# Patient Record
Sex: Male | Born: 1970
Health system: Southern US, Community
[De-identification: ages and names within clinical notes are randomized; demographics above are authoritative.]

## PROBLEM LIST (undated history)

## (undated) DIAGNOSIS — R0683 Snoring: Secondary | ICD-10-CM

## (undated) DIAGNOSIS — R7303 Prediabetes: Secondary | ICD-10-CM

## (undated) DIAGNOSIS — S86011A Strain of right Achilles tendon, initial encounter: Secondary | ICD-10-CM

## (undated) DIAGNOSIS — I10 Essential (primary) hypertension: Secondary | ICD-10-CM

## (undated) DIAGNOSIS — M1990 Unspecified osteoarthritis, unspecified site: Secondary | ICD-10-CM

## (undated) HISTORY — PX: HERNIA REPAIR: SHX51

## (undated) HISTORY — PX: COLONOSCOPY W/ BIOPSIES AND POLYPECTOMY: SHX1376

---

## 2000-02-25 ENCOUNTER — Emergency Department (HOSPITAL_COMMUNITY): Admission: EM | Admit: 2000-02-25 | Discharge: 2000-02-25 | Payer: Self-pay | Admitting: Emergency Medicine

## 2002-11-12 ENCOUNTER — Emergency Department (HOSPITAL_COMMUNITY): Admission: EM | Admit: 2002-11-12 | Discharge: 2002-11-12 | Payer: Self-pay | Admitting: Emergency Medicine

## 2006-10-24 ENCOUNTER — Observation Stay (HOSPITAL_COMMUNITY): Admission: EM | Admit: 2006-10-24 | Discharge: 2006-10-26 | Payer: Self-pay | Admitting: Emergency Medicine

## 2006-10-28 ENCOUNTER — Ambulatory Visit: Payer: Self-pay

## 2006-10-30 ENCOUNTER — Ambulatory Visit: Payer: Self-pay | Admitting: Internal Medicine

## 2010-05-29 NOTE — Consult Note (Signed)
NAMEEMILIAN, STAWICKI NO.:  [id number]   MEDICAL RECORD NO.:  [id number]          PATIENT TYPE:  INP   LOCATION:  3732                         FACILITY:  [facility]   PHYSICIAN:  Bevelyn Buckles. Bensimhon, MDDATE OF BIRTH:  [birth year]   DATE OF CONSULTATION:  10/25/2006  DATE OF DISCHARGE:                                 CONSULTATION   REFERRING PHYSICIAN:  Mobolaji B. Corky Downs, M.D., [employer].   REASON FOR CONSULTATION:  Chest pain.   PRIMARY CARE PHYSICIAN:  Dr. Kern Reap.   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 He did have some episodes of chest pain about 4 years ago  which was thought to be musculoskeletal. He never had a stress test or  cardiac catheterization.   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 He said there was some pressure  and a ripping feeling. He was not diaphoretic, there was no associated  nausea, vomiting or shortness of breath. He went to go see Dr. Barbee Shropshire  but the office was closed so he came to the emergency room. EKG was  normal. The first set of cardiac markers and a D-dimer were also normal.  He got one sublingual nitroglycerin which brought the pain down from a 7  to a 3. Subsequently the patient was almost completely relieved with  morphine but he sort of had a smoldering throughout the night. This  morning he says he feels somewhat better but when he takes a deep breath  he does have some discomfort. He is able to ambulate without pain.   At baseline, he is fairly active with his job and denies any exertional  chest pain or shortness of breath. He does not have a history of  hypertension or hyperlipidemia. There is no significant family history  of coronary artery disease and no trauma.   REVIEW OF SYSTEMS:  All systems negative except for  HPI and problem  list.   PAST MEDICAL HISTORY:  Seasonal allergies.   MEDICATIONS:  At home he just takes Zyrtec p.r.n. for allergies.  Currently he is on Lovenox, aspirin, Protonix and p.r.n. nitroglycerin.   ALLERGIES:  PENICILLIN.   FAMILY HISTORY:  Both parents are alive. Mother has hypertension and  recently diagnosed with COPD. Father is in good health. There is no  family history of premature coronary artery disease.   SOCIAL HISTORY:  He is married, he lives with his family, he has a 26-  year-old son and one on the way. 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 He denies any illicit drugs.   PHYSICAL EXAMINATION:  He is a muscular, well-appearing male in no acute  distress.  Respirations are unlabored. Blood pressure is 135/83, heart rate 74,  satting __________ % on 2 liters nasal cannula.  HEENT:  Normal.  NECK:  Supple. There is  no JVD. Carotids 2+ bilaterally without any  bruits. There is no lymphadenopathy or thyromegaly.  CARDIAC:  PMI is not displaced. He has a regular rate and rhythm with no  murmurs, rubs or gallops.  LUNGS:  Clear.  ABDOMEN:  Soft, nontender, nondistended. There is no hepatosplenomegaly,  no bruits, no masses, no epigastric pain.  EXTREMITIES:  Warm with no clubbing, cyanosis or edema. Good distal  pulses, no rash.  NEUROLOGIC:  Alert and oriented x3. Cranial nerves II-XII are intact.  Moves all 4 extremities without difficulty. Affect is very pleasant.   EKG shows sinus rhythm at a rate of 73. No ST-T wave abnormalities.  Borderline first degree AV block. Troponin is 0.02, then 0.01 and 0.03.  CK-MB is negative. Urine drug screen is negative. D-dimer is less than  0.22. Sodium 138, potassium 4.7, glucose 95, creatinine is 1.1.   ASSESSMENT:  1. Constant atypical chest pain x30 hours now decreasing with question      nitroglycerin responsive. EKG and troponin are  negative.  2. No cardiac risk factors.   PLAN/DISCUSSION:  I suspect this is noncardiac but given a question of  nitroglycerin responsiveness, I will need further risk stratification.  We discussed the risks and indications of both catheterization and  stress testing, he prefers testing. If his chest pain is relieved, we  will plan discharge home tomorrow with outpatient Myoview Monday or  Tuesday. If his chest pain recurs will consider inpatient Myoview on  Monday versus cardiac catheterization.   Of note, the ripping feeling he described is somewhat concerning for  aortic dissection though he does not really have any risk factors for  this; however, I think it would be prudent just to go ahead and get a CT  scan of the chest for safety sake.      Bevelyn Buckles. Bensimhon, MD  Electronically Signed     DRB/MEDQ  D:  10/25/2006  T:  10/25/2006  Job:  [id number]   cc:   Mobolaji B. Corky Downs, M.D.  Olene Craven, M.D.

## 2010-05-29 NOTE — H&P (Signed)
NAMEANDI, MAHAFFY NO.:  [id number]   MEDICAL RECORD NO.:  [id number]          PATIENT TYPE:  EMS   LOCATION:  MAJO                         FACILITY:  [facility]   PHYSICIAN:  Mobolaji B. Bakare, M.D.DATE OF BIRTH:  09-May-1970   DATE OF ADMISSION:  10/24/2006  DATE OF DISCHARGE:                              HISTORY & PHYSICAL   PRIMARY CARE PHYSICIAN:  Olene Craven, M.D.   CHIEF COMPLAINT:  Chest pain.   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  He developed sudden central chest pain which he rated at 7/10.  He was not diaphoretic.  There was no associated nausea, vomiting, or  shortness of breath.  The pain did not go away.  He decided to call his  wife, who brought him to the emergency room.   Patient stated that he has had borderline cholesterol, but he has not  been on medications.  He has been watching his diet.   On arrival in the emergency room, patient was hemodynamically stable.  He had a normal sinus EKG without acute ST changes.  His first set of  cardiac markers were normal.  His D-dimer was also normal.  He received  sublingual nitroglycerin one dose, which brought the pain from 7 to 3.  Subsequently, the pain was almost completely relieved by morphine IV.   Patient has not had similar pain in the recent past.  He denied any  chest trauma or physical exertion recently that could contribute to  these.  He states that on deep breathing, this aggravates the chest  pain.  There is no cough or shortness of breath.  No recent travel or  lower extremity swelling.   REVIEW OF SYSTEMS:  He denies fever, chills, nausea, vomiting, diarrhea.  No dysuria or urgency.   PAST MEDICAL HISTORY:  Seasonal allergies.   PAST SURGICAL HISTORY:  None.   MEDICATIONS:  He uses Zyrtec for seasonal allergies.   ALLERGIES:  PENICILLIN.   FAMILY HISTORY:   Both parents are alive.  Mother has hypertension.  Father has no known medical history.  There is no family history of  premature coronary artery disease.   SOCIAL HISTORY:  He is married.  He lives with his family.  He has a 69-  year-old son.  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  He does not smoke cigarettes or use alcohol.  He  denies the use of illicit drugs.   PHYSICAL EXAMINATION:  INITIAL VITALS:  Temperature 98.8, blood pressure  157/96.  Subsequent recheck was 142/91.  Pulse 70.  Respiratory rate 14.  O2 sats 98%.  On examination, he is awake, alert and oriented to time, place, and  person.  Normocephalic.  Extraocular muscle movements are intact.  No elevated JVD.  There is no carotid bruit.  No cervical  lymphadenopathy or thyromegaly.  Mucous membranes are moist.  There is no oral thrush.  LUNGS:  Clear clinically  to auscultation.  CVS:  S1 and S2.  Regular.  No murmur.  ABDOMEN:  Nondistended.  Soft, nontender.  Bowel sounds present.  No  palpable organomegaly.  CHEST WALL:  There is mild tenderness over the manubrium, but patient  did not relate this to the pain he had.  EXTREMITIES:  No pedal edema or calf tenderness.  No cyanosis.  CNS:  No focal neurological deficit.   INITIAL LABORATORY DATA:  Sodium 138, potassium 4.7, chloride 108, BUN  14, creatinine 1.1.  Urine drug screen nondetected.  Cardiac markers at  the point of care x1 set was normal.   ASSESSMENT/PLAN:  Mr. Meador is a 40 year old African-American male with  known history of borderline hypercholesterolemia presenting with  retrosternal chest pain.  This does not sound typical.  He had a normal  EKG, and four sets of cardiac markers is negative.  D-dimer is normal.  Hemoglobin 16, hematocrit 47.  He is noted to have high blood pressure  on arrival in the emergency department, but this is trending downwards  after relief of pain.  Will continue to monitor blood  pressure during  the course of hospitalization.  Patient will be admitted to rule out  myocardial infarction.  It appears that he has low risk for coronary  artery disease.  We checked fasting lipid profile, hemoglobin A1C, cycle  cardiac enzymes q.8h. x3.  Will give morphine 1-2 mg IV q.2h. p.r.n.  chest pain, sublingual nitroglycerin p.r.n., aspirin 325 mg p.o. daily.  Will check a 2D echocardiogram.      Mobolaji B. Corky Downs, M.D.  Electronically Signed     MBB/MEDQ  D:  10/24/2006  T:  10/24/2006  Job:  [id number]   cc:   Olene Craven, M.D.

## 2010-06-01 NOTE — Discharge Summary (Signed)
NAMEKOSEI, RHODES                 ACCOUNT NO.:  [id number]   MEDICAL RECORD NO.:  [id number]          PATIENT TYPE:  OBV   LOCATION:  3732                         FACILITY:  [facility]   PHYSICIAN:  Mobolaji B. Bakare, M.D.DATE OF BIRTH:  [birth year]   DATE OF ADMISSION:  10/24/2006  DATE OF DISCHARGE:  10/26/2006                               DISCHARGE SUMMARY   FINAL DIAGNOSIS:  1. Atypical chest pain.  2. Dyslipidemia.   PROCEDURES:  1. Chest X-Ray: Showed no acute disease.  2. CT angiogram of the chest--no evidence of pulmonary embolism, no      evidence of aortic aneurysm or dissection, no acute thoracic      abnormalities.   CONSULT:  Cardiology consult provided by Bevelyn Buckles. Bensimhon, MD.   BRIEF HISTORY:  Please refer to the admission H&P by Dr. Corky Downs for full  details.  In brief, Mr. Blouch is a 40 year old African-American male  with a past medical history of dyslipidemia, but he was not on  medications,.  The patient was well and in his usual state of health  until about 9:30 on the morning of admission, when he developed sudden  central chest pain at work.  He was not exerting himself at that point.  He was not diaphoretic.  There was no associated nausea, vomiting, or  shortness of breath.  He called his wife who brought him to the  emergency room.   On initial evaluation in the emergency room, the EKG showed normal sinus  rhythm, cardiac markers at the point of care were normal.  He received  sublingual nitroglycerin which brought the pain down from 7 to 3 and he  became completely abated with IV morphine.  Physical examination did not  suggest musculoskeletal tenderness.  He was admitted for further  evaluation.   The patient developed recurrent episodes of chest pain about twice  during the course of hospitalization and on each episode he stated that  he had some relief with sublingual nitroglycerin.  He eventually ruled  out for myocardial infarction with three  negative sets of cardiac panel  EKG which they reported chest pain were normal.  Cardiology was  consulted for further evaluation. CT angiogram of the chest was  recommended to rule out dissection or pulmonary embolism.  CT was  unremarkable.  The patient was made to do an outpatient Myoview study.  This was set up by Dr. Prescott Gum office.   The patient had a fasting lipid profile with total cholesterol of 180,  LDL of 122, HDL of 29.  He was counseled on low-fat diet and started on  Zocor.  His blood pressure was normal during the course of  hospitalization.  He is not a smoker.  There is no family history of  premature coronary artery disease.  The patient is felt to have a low  risk for coronary artery disease.  However, it was prudent to pursue  Myoview study in view of symptoms being relieved by nitroglycerin.   He did not describe heartburn as part of the chest pain complex;  however, he was  given a trial of PPI during this hospitalization, and  instructed to use Prilosec OTC 1 tablet daily for 2 weeks and see if  this helps with the pain.   DISCHARGE MEDICATIONS:  1. Aspirin 81 mg daily.  2. Zocor 20 mg daily.  3. Prilosec OTC once daily.   FOLLOWUP:  1. With Dr. Barbee Shropshire in 1-2 weeks.  2. Followup with Dr. Gala Romney for Myoview study.      Mobolaji B. Corky Downs, M.D.  Electronically Signed     MBB/MEDQ  D:  11/09/2006  T:  11/10/2006  Job:  [id number]

## 2010-10-25 LAB — RAPID URINE DRUG SCREEN, HOSP PERFORMED
Amphetamines: NOT DETECTED
Barbiturates: NOT DETECTED
Benzodiazepines: NOT DETECTED
Cocaine: NOT DETECTED
Opiates: NOT DETECTED
Tetrahydrocannabinol: NOT DETECTED

## 2010-10-25 LAB — CK TOTAL AND CKMB (NOT AT ARMC)
CK, MB: 0.9
CK, MB: 1.4
CK, MB: 1.9
Relative Index: 0.6
Relative Index: 0.7
Relative Index: INVALID
Total CK: 234 — ABNORMAL HIGH
Total CK: 292 — ABNORMAL HIGH
Total CK: 33

## 2010-10-25 LAB — I-STAT 8, (EC8 V) (CONVERTED LAB)
BUN: 14
Bicarbonate: 23.6
Chloride: 108
Glucose, Bld: 95
HCT: 47
Hemoglobin: 16
Operator id: 198171
Potassium: 4.7
Sodium: 138
TCO2: 25
pCO2, Ven: 35.3 — ABNORMAL LOW
pH, Ven: 7.433 — ABNORMAL HIGH

## 2010-10-25 LAB — TROPONIN I
Troponin I: 0.01
Troponin I: 0.02
Troponin I: 0.03

## 2010-10-25 LAB — POCT CARDIAC MARKERS
CKMB, poc: 1.1
CKMB, poc: 1.1
Myoglobin, poc: 63.7
Myoglobin, poc: 72.8
Operator id: 198171
Operator id: 198171
Troponin i, poc: <0.05
Troponin i, poc: <0.05

## 2010-10-25 LAB — LIPID PANEL
Cholesterol: 180
HDL: 39 — ABNORMAL LOW
LDL Cholesterol: 122 — ABNORMAL HIGH
Total CHOL/HDL Ratio: 4.6
Triglycerides: 93
VLDL: 19

## 2010-10-25 LAB — POCT I-STAT CREATININE
Creatinine, Ser: 1.1
Operator id: 198171

## 2010-10-25 LAB — HEMOGLOBIN A1C
Hgb A1c MFr Bld: 5.7
Mean Plasma Glucose: 126

## 2010-10-25 LAB — TSH: TSH: 0.992

## 2010-10-25 LAB — D-DIMER, QUANTITATIVE: D-Dimer, Quant: <0.22

## 2011-04-01 ENCOUNTER — Ambulatory Visit
Admission: RE | Admit: 2011-04-01 | Discharge: 2011-04-01 | Disposition: A | Discharge status: Home / Self Care | Payer: 59 | Source: Ambulatory Visit | Attending: Family Medicine | Admitting: Family Medicine

## 2011-04-01 ENCOUNTER — Other Ambulatory Visit: Payer: Self-pay | Admitting: Family Medicine

## 2011-04-01 DIAGNOSIS — R52 Pain, unspecified: Secondary | ICD-10-CM

## 2011-06-19 ENCOUNTER — Other Ambulatory Visit: Payer: Self-pay | Admitting: General Surgery

## 2013-11-02 ENCOUNTER — Other Ambulatory Visit (HOSPITAL_COMMUNITY)
Admission: RE | Admit: 2013-11-02 | Discharge: 2013-11-02 | Disposition: A | Discharge status: Home / Self Care | Payer: 59 | Source: Ambulatory Visit | Attending: Family Medicine | Admitting: Family Medicine

## 2013-11-02 DIAGNOSIS — Z113 Encounter for screening for infections with a predominantly sexual mode of transmission: Secondary | ICD-10-CM | POA: Insufficient documentation

## 2016-02-22 DIAGNOSIS — I10 Essential (primary) hypertension: Secondary | ICD-10-CM | POA: Diagnosis not present

## 2016-02-22 DIAGNOSIS — B353 Tinea pedis: Secondary | ICD-10-CM | POA: Diagnosis not present

## 2016-03-27 DIAGNOSIS — I10 Essential (primary) hypertension: Secondary | ICD-10-CM | POA: Diagnosis not present

## 2016-05-02 DIAGNOSIS — I10 Essential (primary) hypertension: Secondary | ICD-10-CM | POA: Diagnosis not present

## 2016-05-02 DIAGNOSIS — E784 Other hyperlipidemia: Secondary | ICD-10-CM | POA: Diagnosis not present

## 2016-05-02 DIAGNOSIS — J399 Disease of upper respiratory tract, unspecified: Secondary | ICD-10-CM | POA: Diagnosis not present

## 2016-06-25 DIAGNOSIS — I10 Essential (primary) hypertension: Secondary | ICD-10-CM | POA: Diagnosis not present

## 2016-06-25 DIAGNOSIS — E785 Hyperlipidemia, unspecified: Secondary | ICD-10-CM | POA: Diagnosis not present

## 2016-07-23 DIAGNOSIS — I10 Essential (primary) hypertension: Secondary | ICD-10-CM | POA: Diagnosis not present

## 2016-07-23 DIAGNOSIS — J399 Disease of upper respiratory tract, unspecified: Secondary | ICD-10-CM | POA: Diagnosis not present

## 2016-10-01 DIAGNOSIS — R293 Abnormal posture: Secondary | ICD-10-CM | POA: Diagnosis not present

## 2016-10-01 DIAGNOSIS — M2560 Stiffness of unspecified joint, not elsewhere classified: Secondary | ICD-10-CM | POA: Diagnosis not present

## 2016-10-01 DIAGNOSIS — M542 Cervicalgia: Secondary | ICD-10-CM | POA: Diagnosis not present

## 2016-10-07 DIAGNOSIS — M542 Cervicalgia: Secondary | ICD-10-CM | POA: Diagnosis not present

## 2016-10-07 DIAGNOSIS — R293 Abnormal posture: Secondary | ICD-10-CM | POA: Diagnosis not present

## 2016-10-07 DIAGNOSIS — M2560 Stiffness of unspecified joint, not elsewhere classified: Secondary | ICD-10-CM | POA: Diagnosis not present

## 2016-10-10 DIAGNOSIS — M2560 Stiffness of unspecified joint, not elsewhere classified: Secondary | ICD-10-CM | POA: Diagnosis not present

## 2016-10-10 DIAGNOSIS — M542 Cervicalgia: Secondary | ICD-10-CM | POA: Diagnosis not present

## 2016-10-10 DIAGNOSIS — R293 Abnormal posture: Secondary | ICD-10-CM | POA: Diagnosis not present

## 2016-11-04 DIAGNOSIS — E785 Hyperlipidemia, unspecified: Secondary | ICD-10-CM | POA: Diagnosis not present

## 2016-11-04 DIAGNOSIS — Z23 Encounter for immunization: Secondary | ICD-10-CM | POA: Diagnosis not present

## 2016-11-04 DIAGNOSIS — I10 Essential (primary) hypertension: Secondary | ICD-10-CM | POA: Diagnosis not present

## 2016-11-19 DIAGNOSIS — I10 Essential (primary) hypertension: Secondary | ICD-10-CM | POA: Diagnosis not present

## 2016-11-19 DIAGNOSIS — Z125 Encounter for screening for malignant neoplasm of prostate: Secondary | ICD-10-CM | POA: Diagnosis not present

## 2017-06-16 DIAGNOSIS — K5900 Constipation, unspecified: Secondary | ICD-10-CM | POA: Diagnosis not present

## 2017-06-16 DIAGNOSIS — I10 Essential (primary) hypertension: Secondary | ICD-10-CM | POA: Diagnosis not present

## 2017-06-16 DIAGNOSIS — E785 Hyperlipidemia, unspecified: Secondary | ICD-10-CM | POA: Diagnosis not present

## 2018-02-04 DIAGNOSIS — Z0100 Encounter for examination of eyes and vision without abnormal findings: Secondary | ICD-10-CM | POA: Diagnosis not present

## 2018-02-09 DIAGNOSIS — M5412 Radiculopathy, cervical region: Secondary | ICD-10-CM | POA: Diagnosis not present

## 2018-02-09 DIAGNOSIS — M542 Cervicalgia: Secondary | ICD-10-CM | POA: Diagnosis not present

## 2018-02-11 DIAGNOSIS — M25521 Pain in right elbow: Secondary | ICD-10-CM | POA: Diagnosis not present

## 2018-02-11 DIAGNOSIS — I10 Essential (primary) hypertension: Secondary | ICD-10-CM | POA: Diagnosis not present

## 2018-02-11 DIAGNOSIS — M542 Cervicalgia: Secondary | ICD-10-CM | POA: Diagnosis not present

## 2018-03-13 DIAGNOSIS — M6281 Muscle weakness (generalized): Secondary | ICD-10-CM | POA: Diagnosis not present

## 2018-03-13 DIAGNOSIS — M542 Cervicalgia: Secondary | ICD-10-CM | POA: Diagnosis not present

## 2018-03-18 DIAGNOSIS — M6281 Muscle weakness (generalized): Secondary | ICD-10-CM | POA: Diagnosis not present

## 2018-03-18 DIAGNOSIS — M542 Cervicalgia: Secondary | ICD-10-CM | POA: Diagnosis not present

## 2018-03-20 DIAGNOSIS — M6281 Muscle weakness (generalized): Secondary | ICD-10-CM | POA: Diagnosis not present

## 2018-03-20 DIAGNOSIS — M542 Cervicalgia: Secondary | ICD-10-CM | POA: Diagnosis not present

## 2018-03-25 DIAGNOSIS — M6281 Muscle weakness (generalized): Secondary | ICD-10-CM | POA: Diagnosis not present

## 2018-03-25 DIAGNOSIS — M542 Cervicalgia: Secondary | ICD-10-CM | POA: Diagnosis not present

## 2018-03-26 DIAGNOSIS — L816 Other disorders of diminished melanin formation: Secondary | ICD-10-CM | POA: Diagnosis not present

## 2018-03-26 DIAGNOSIS — D485 Neoplasm of uncertain behavior of skin: Secondary | ICD-10-CM | POA: Diagnosis not present

## 2018-03-27 DIAGNOSIS — M6281 Muscle weakness (generalized): Secondary | ICD-10-CM | POA: Diagnosis not present

## 2018-03-27 DIAGNOSIS — M542 Cervicalgia: Secondary | ICD-10-CM | POA: Diagnosis not present

## 2018-04-01 DIAGNOSIS — M6281 Muscle weakness (generalized): Secondary | ICD-10-CM | POA: Diagnosis not present

## 2018-04-01 DIAGNOSIS — M542 Cervicalgia: Secondary | ICD-10-CM | POA: Diagnosis not present

## 2018-04-03 DIAGNOSIS — M542 Cervicalgia: Secondary | ICD-10-CM | POA: Diagnosis not present

## 2018-04-03 DIAGNOSIS — M6281 Muscle weakness (generalized): Secondary | ICD-10-CM | POA: Diagnosis not present

## 2018-04-06 DIAGNOSIS — M542 Cervicalgia: Secondary | ICD-10-CM | POA: Diagnosis not present

## 2018-04-06 DIAGNOSIS — E669 Obesity, unspecified: Secondary | ICD-10-CM | POA: Diagnosis not present

## 2018-04-06 DIAGNOSIS — E7849 Other hyperlipidemia: Secondary | ICD-10-CM | POA: Diagnosis not present

## 2018-04-06 DIAGNOSIS — R7303 Prediabetes: Secondary | ICD-10-CM | POA: Diagnosis not present

## 2018-04-08 DIAGNOSIS — M542 Cervicalgia: Secondary | ICD-10-CM | POA: Diagnosis not present

## 2018-04-08 DIAGNOSIS — M6281 Muscle weakness (generalized): Secondary | ICD-10-CM | POA: Diagnosis not present

## 2018-04-15 DIAGNOSIS — M6281 Muscle weakness (generalized): Secondary | ICD-10-CM | POA: Diagnosis not present

## 2018-04-15 DIAGNOSIS — M542 Cervicalgia: Secondary | ICD-10-CM | POA: Diagnosis not present

## 2018-04-17 DIAGNOSIS — M542 Cervicalgia: Secondary | ICD-10-CM | POA: Diagnosis not present

## 2018-04-17 DIAGNOSIS — M6281 Muscle weakness (generalized): Secondary | ICD-10-CM | POA: Diagnosis not present

## 2018-04-22 DIAGNOSIS — M542 Cervicalgia: Secondary | ICD-10-CM | POA: Diagnosis not present

## 2018-04-22 DIAGNOSIS — M6281 Muscle weakness (generalized): Secondary | ICD-10-CM | POA: Diagnosis not present

## 2018-04-24 DIAGNOSIS — M542 Cervicalgia: Secondary | ICD-10-CM | POA: Diagnosis not present

## 2018-04-24 DIAGNOSIS — M6281 Muscle weakness (generalized): Secondary | ICD-10-CM | POA: Diagnosis not present

## 2018-04-27 DIAGNOSIS — D179 Benign lipomatous neoplasm, unspecified: Secondary | ICD-10-CM | POA: Diagnosis not present

## 2018-04-27 DIAGNOSIS — D485 Neoplasm of uncertain behavior of skin: Secondary | ICD-10-CM | POA: Diagnosis not present

## 2020-03-31 ENCOUNTER — Ambulatory Visit: Payer: Self-pay

## 2020-03-31 ENCOUNTER — Other Ambulatory Visit: Payer: Self-pay

## 2020-03-31 ENCOUNTER — Other Ambulatory Visit: Payer: Self-pay | Admitting: Occupational Medicine

## 2020-03-31 DIAGNOSIS — M79661 Pain in right lower leg: Secondary | ICD-10-CM

## 2020-05-15 ENCOUNTER — Other Ambulatory Visit (HOSPITAL_COMMUNITY): Payer: Self-pay | Admitting: Orthopedic Surgery

## 2020-05-16 ENCOUNTER — Encounter (HOSPITAL_BASED_OUTPATIENT_CLINIC_OR_DEPARTMENT_OTHER): Payer: Self-pay | Admitting: Orthopedic Surgery

## 2020-05-16 ENCOUNTER — Other Ambulatory Visit (HOSPITAL_COMMUNITY)
Admission: RE | Admit: 2020-05-16 | Discharge: 2020-05-16 | Disposition: A | Discharge status: Home / Self Care | Payer: 59 | Source: Ambulatory Visit | Attending: Orthopedic Surgery | Admitting: Orthopedic Surgery

## 2020-05-16 ENCOUNTER — Encounter (HOSPITAL_BASED_OUTPATIENT_CLINIC_OR_DEPARTMENT_OTHER)
Admission: RE | Admit: 2020-05-16 | Discharge: 2020-05-16 | Disposition: A | Discharge status: Home / Self Care | Payer: 59 | Source: Ambulatory Visit | Attending: Orthopedic Surgery | Admitting: Orthopedic Surgery

## 2020-05-16 ENCOUNTER — Other Ambulatory Visit: Payer: Self-pay

## 2020-05-16 DIAGNOSIS — Z01818 Encounter for other preprocedural examination: Secondary | ICD-10-CM | POA: Insufficient documentation

## 2020-05-16 DIAGNOSIS — Z20822 Contact with and (suspected) exposure to covid-19: Secondary | ICD-10-CM | POA: Insufficient documentation

## 2020-05-16 DIAGNOSIS — I10 Essential (primary) hypertension: Secondary | ICD-10-CM | POA: Insufficient documentation

## 2020-05-16 DIAGNOSIS — Z01812 Encounter for preprocedural laboratory examination: Secondary | ICD-10-CM | POA: Insufficient documentation

## 2020-05-16 LAB — BASIC METABOLIC PANEL
Anion gap: 7 (ref 5–15)
BUN: 14 mg/dL (ref 6–20)
CO2: 23 mmol/L (ref 22–32)
Calcium: 8.9 mg/dL (ref 8.9–10.3)
Chloride: 105 mmol/L (ref 98–111)
Creatinine, Ser: 0.98 mg/dL (ref 0.61–1.24)
GFR, Estimated: >60 mL/min (ref >60)
Glucose, Bld: 123 mg/dL — ABNORMAL HIGH (ref 70–99)
Potassium: 4.1 mmol/L (ref 3.5–5.1)
Sodium: 135 mmol/L (ref 135–145)

## 2020-05-16 NOTE — Progress Notes (Signed)

## 2020-05-17 LAB — SARS CORONAVIRUS 2 (TAT 6-24 HRS): SARS Coronavirus 2: NEGATIVE

## 2020-05-17 NOTE — Progress Notes (Signed)
Sent message reminding pt to come in for lab work.

## 2020-05-18 ENCOUNTER — Ambulatory Visit (HOSPITAL_BASED_OUTPATIENT_CLINIC_OR_DEPARTMENT_OTHER)
Admission: RE | Admit: 2020-05-18 | Discharge: 2020-05-18 | Disposition: A | Discharge status: Home / Self Care | Payer: No Typology Code available for payment source | Attending: Orthopedic Surgery | Admitting: Orthopedic Surgery

## 2020-05-18 ENCOUNTER — Ambulatory Visit (HOSPITAL_BASED_OUTPATIENT_CLINIC_OR_DEPARTMENT_OTHER): Payer: No Typology Code available for payment source | Admitting: Certified Registered"

## 2020-05-18 ENCOUNTER — Other Ambulatory Visit: Payer: Self-pay

## 2020-05-18 ENCOUNTER — Encounter (HOSPITAL_BASED_OUTPATIENT_CLINIC_OR_DEPARTMENT_OTHER): Payer: Self-pay | Admitting: Orthopedic Surgery

## 2020-05-18 ENCOUNTER — Encounter (HOSPITAL_BASED_OUTPATIENT_CLINIC_OR_DEPARTMENT_OTHER)
Admission: RE | Disposition: A | Discharge status: Home / Self Care | Payer: Self-pay | Source: Home / Self Care | Attending: Orthopedic Surgery

## 2020-05-18 DIAGNOSIS — Z79899 Other long term (current) drug therapy: Secondary | ICD-10-CM | POA: Diagnosis not present

## 2020-05-18 DIAGNOSIS — M62461 Contracture of muscle, right lower leg: Secondary | ICD-10-CM | POA: Diagnosis not present

## 2020-05-18 DIAGNOSIS — Z7982 Long term (current) use of aspirin: Secondary | ICD-10-CM | POA: Diagnosis not present

## 2020-05-18 DIAGNOSIS — S86011A Strain of right Achilles tendon, initial encounter: Secondary | ICD-10-CM | POA: Diagnosis present

## 2020-05-18 DIAGNOSIS — Y9367 Activity, basketball: Secondary | ICD-10-CM | POA: Insufficient documentation

## 2020-05-18 DIAGNOSIS — Z791 Long term (current) use of non-steroidal anti-inflammatories (NSAID): Secondary | ICD-10-CM | POA: Diagnosis not present

## 2020-05-18 DIAGNOSIS — Z880 Allergy status to penicillin: Secondary | ICD-10-CM | POA: Diagnosis not present

## 2020-05-18 HISTORY — DX: Prediabetes: R73.03

## 2020-05-18 HISTORY — DX: Essential (primary) hypertension: I10

## 2020-05-18 HISTORY — PX: ACHILLES TENDON SURGERY: SHX542

## 2020-05-18 HISTORY — PX: GASTROC RECESSION EXTREMITY: SHX6262

## 2020-05-18 HISTORY — DX: Snoring: R06.83

## 2020-05-18 HISTORY — DX: Unspecified osteoarthritis, unspecified site: M19.90

## 2020-05-18 HISTORY — DX: Strain of right Achilles tendon, initial encounter: S86.011A

## 2020-05-18 SURGERY — REPAIR, TENDON, ACHILLES
Anesthesia: General | Site: Leg Lower | Laterality: Right

## 2020-05-18 MED ORDER — RIVAROXABAN 10 MG PO TABS: 10.0000 mg | ORAL_TABLET | Freq: Every day | ORAL | 0 refills | Status: DC

## 2020-05-18 MED ORDER — ONDANSETRON HCL 4 MG/2ML IJ SOLN
INTRAMUSCULAR | Status: AC
  Filled 2020-05-18: qty 14

## 2020-05-18 MED ORDER — ROCURONIUM BROMIDE 100 MG/10ML IV SOLN
INTRAVENOUS | Status: DC | PRN
  Administered 2020-05-18: 80 mg via INTRAVENOUS

## 2020-05-18 MED ORDER — ROCURONIUM BROMIDE 10 MG/ML (PF) SYRINGE
PREFILLED_SYRINGE | INTRAVENOUS | Status: AC
  Filled 2020-05-18: qty 10

## 2020-05-18 MED ORDER — CEFAZOLIN SODIUM-DEXTROSE 2-4 GM/100ML-% IV SOLN
INTRAVENOUS | Status: AC
  Filled 2020-05-18: qty 100

## 2020-05-18 MED ORDER — MIDAZOLAM HCL 2 MG/2ML IJ SOLN
2.0000 mg | Freq: Once | INTRAMUSCULAR | Status: AC
  Administered 2020-05-18: 2 mg via INTRAVENOUS

## 2020-05-18 MED ORDER — MEPERIDINE HCL 25 MG/ML IJ SOLN: 6.2500 mg | INTRAMUSCULAR | Status: DC | PRN

## 2020-05-18 MED ORDER — MIDAZOLAM HCL 2 MG/2ML IJ SOLN
INTRAMUSCULAR | Status: AC
  Filled 2020-05-18: qty 2

## 2020-05-18 MED ORDER — PROMETHAZINE HCL 25 MG/ML IJ SOLN: 6.2500 mg | INTRAMUSCULAR | Status: DC | PRN

## 2020-05-18 MED ORDER — OXYCODONE HCL 5 MG PO TABS: 5.0000 mg | ORAL_TABLET | ORAL | 0 refills | Status: AC | PRN

## 2020-05-18 MED ORDER — 0.9 % SODIUM CHLORIDE (POUR BTL) OPTIME
TOPICAL | Status: DC | PRN
  Administered 2020-05-18: 300 mL

## 2020-05-18 MED ORDER — AMISULPRIDE (ANTIEMETIC) 5 MG/2ML IV SOLN: 10.0000 mg | Freq: Once | INTRAVENOUS | Status: DC | PRN

## 2020-05-18 MED ORDER — LACTATED RINGERS IV SOLN: INTRAVENOUS | Status: DC

## 2020-05-18 MED ORDER — DEXAMETHASONE SODIUM PHOSPHATE 4 MG/ML IJ SOLN
INTRAMUSCULAR | Status: DC | PRN
  Administered 2020-05-18: 4 mg via INTRAVENOUS

## 2020-05-18 MED ORDER — PROPOFOL 10 MG/ML IV BOLUS
INTRAVENOUS | Status: AC
  Filled 2020-05-18: qty 20

## 2020-05-18 MED ORDER — FENTANYL CITRATE (PF) 100 MCG/2ML IJ SOLN
INTRAMUSCULAR | Status: AC
  Filled 2020-05-18: qty 2

## 2020-05-18 MED ORDER — OXYCODONE HCL 5 MG PO TABS
5.0000 mg | ORAL_TABLET | Freq: Once | ORAL | Status: AC | PRN
Start: 2020-05-18 — End: 2020-05-18
  Administered 2020-05-18: 5 mg via ORAL

## 2020-05-18 MED ORDER — ACETAMINOPHEN 10 MG/ML IV SOLN
INTRAVENOUS | Status: AC
  Filled 2020-05-18: qty 100

## 2020-05-18 MED ORDER — DOCUSATE SODIUM 100 MG PO CAPS: 100.0000 mg | ORAL_CAPSULE | Freq: Two times a day (BID) | ORAL | 0 refills | Status: DC

## 2020-05-18 MED ORDER — LIDOCAINE HCL (CARDIAC) PF 100 MG/5ML IV SOSY
PREFILLED_SYRINGE | INTRAVENOUS | Status: DC | PRN
  Administered 2020-05-18: 30 mg via INTRAVENOUS

## 2020-05-18 MED ORDER — HYDROMORPHONE HCL 1 MG/ML IJ SOLN
INTRAMUSCULAR | Status: AC
  Filled 2020-05-18: qty 0.5

## 2020-05-18 MED ORDER — DEXMEDETOMIDINE (PRECEDEX) IN NS 20 MCG/5ML (4 MCG/ML) IV SYRINGE
PREFILLED_SYRINGE | INTRAVENOUS | Status: AC
  Filled 2020-05-18: qty 10

## 2020-05-18 MED ORDER — CEFAZOLIN SODIUM-DEXTROSE 2-4 GM/100ML-% IV SOLN
2.0000 g | INTRAVENOUS | Status: AC
  Administered 2020-05-18: 3 g via INTRAVENOUS

## 2020-05-18 MED ORDER — SUGAMMADEX SODIUM 200 MG/2ML IV SOLN
INTRAVENOUS | Status: DC | PRN
  Administered 2020-05-18: 200 mg via INTRAVENOUS

## 2020-05-18 MED ORDER — OXYCODONE HCL 5 MG/5ML PO SOLN: 5.0000 mg | Freq: Once | ORAL | Status: AC | PRN

## 2020-05-18 MED ORDER — PROPOFOL 10 MG/ML IV BOLUS
INTRAVENOUS | Status: DC | PRN
  Administered 2020-05-18: 200 mg via INTRAVENOUS

## 2020-05-18 MED ORDER — OXYCODONE HCL 5 MG PO TABS
ORAL_TABLET | ORAL | Status: AC
  Filled 2020-05-18: qty 1

## 2020-05-18 MED ORDER — HYDROMORPHONE HCL 1 MG/ML IJ SOLN
0.2500 mg | INTRAMUSCULAR | Status: DC | PRN
  Administered 2020-05-18: 0.5 mg via INTRAVENOUS
  Administered 2020-05-18 (×2): 0.25 mg via INTRAVENOUS

## 2020-05-18 MED ORDER — FENTANYL CITRATE (PF) 100 MCG/2ML IJ SOLN
100.0000 ug | Freq: Once | INTRAMUSCULAR | Status: AC
  Administered 2020-05-18: 100 ug via INTRAVENOUS

## 2020-05-18 MED ORDER — SODIUM CHLORIDE 0.9 % IV SOLN: INTRAVENOUS | Status: DC

## 2020-05-18 MED ORDER — VANCOMYCIN HCL 500 MG IV SOLR
INTRAVENOUS | Status: DC | PRN
  Administered 2020-05-18: 500 mg via TOPICAL

## 2020-05-18 MED ORDER — ROPIVACAINE HCL 5 MG/ML IJ SOLN
INTRAMUSCULAR | Status: DC | PRN
  Administered 2020-05-18: 30 mL via PERINEURAL

## 2020-05-18 MED ORDER — PROPOFOL 500 MG/50ML IV EMUL
INTRAVENOUS | Status: AC
  Filled 2020-05-18: qty 150

## 2020-05-18 MED ORDER — ACETAMINOPHEN 10 MG/ML IV SOLN
1000.0000 mg | Freq: Once | INTRAVENOUS | Status: AC
  Administered 2020-05-18: 1000 mg via INTRAVENOUS

## 2020-05-18 MED ORDER — DEXAMETHASONE SODIUM PHOSPHATE 10 MG/ML IJ SOLN
INTRAMUSCULAR | Status: AC
  Filled 2020-05-18: qty 2

## 2020-05-18 MED ORDER — SENNA 8.6 MG PO TABS: 2.0000 | ORAL_TABLET | Freq: Two times a day (BID) | ORAL | 0 refills | Status: DC

## 2020-05-18 MED ORDER — SODIUM CHLORIDE 0.9 % IV SOLN
INTRAVENOUS | Status: AC
  Filled 2020-05-18: qty 10

## 2020-05-18 MED ORDER — FENTANYL CITRATE (PF) 100 MCG/2ML IJ SOLN
INTRAMUSCULAR | Status: DC | PRN
  Administered 2020-05-18 (×2): 50 ug via INTRAVENOUS

## 2020-05-18 SURGICAL SUPPLY — 68 items
APL PRP STRL LF DISP 70% ISPRP (MISCELLANEOUS) ×1
BANDAGE ESMARK 6X9 LF (GAUZE/BANDAGES/DRESSINGS) ×3 IMPLANT
BLADE AVERAGE 25X9 (BLADE) IMPLANT
BLADE MICRO SAGITTAL (BLADE) IMPLANT
BLADE SURG 15 STRL LF DISP TIS (BLADE) ×2 IMPLANT
BLADE SURG 15 STRL SS (BLADE) ×4
BNDG CMPR 9X6 STRL LF SNTH (GAUZE/BANDAGES/DRESSINGS) ×3
BNDG COHESIVE 4X5 TAN STRL (GAUZE/BANDAGES/DRESSINGS) ×2 IMPLANT
BNDG COHESIVE 6X5 TAN STRL LF (GAUZE/BANDAGES/DRESSINGS) ×2 IMPLANT
BNDG ESMARK 6X9 LF (GAUZE/BANDAGES/DRESSINGS) ×6
CANISTER SUCT 1200ML W/VALVE (MISCELLANEOUS) ×2 IMPLANT
CHLORAPREP W/TINT 26 (MISCELLANEOUS) ×2 IMPLANT
COVER BACK TABLE 60X90IN (DRAPES) ×2 IMPLANT
COVER WAND RF STERILE (DRAPES) IMPLANT
CUFF TOURN SGL QUICK 34 (TOURNIQUET CUFF) ×2
CUFF TRNQT CYL 34X4.125X (TOURNIQUET CUFF) ×1 IMPLANT
DRAPE EXTREMITY T 121X128X90 (DISPOSABLE) ×2 IMPLANT
DRAPE OEC MINIVIEW 54X84 (DRAPES) IMPLANT
DRAPE U-SHAPE 47X51 STRL (DRAPES) ×2 IMPLANT
DRSG MEPITEL 4X7.2 (GAUZE/BANDAGES/DRESSINGS) ×2 IMPLANT
DRSG PAD ABDOMINAL 8X10 ST (GAUZE/BANDAGES/DRESSINGS) ×4 IMPLANT
ELECT REM PT RETURN 9FT ADLT (ELECTROSURGICAL) ×2
ELECTRODE REM PT RTRN 9FT ADLT (ELECTROSURGICAL) ×1 IMPLANT
GAUZE SPONGE 4X4 12PLY STRL (GAUZE/BANDAGES/DRESSINGS) ×2 IMPLANT
GLOVE SRG 8 PF TXTR STRL LF DI (GLOVE) ×2 IMPLANT
GLOVE SURG ENC MOIS LTX SZ8 (GLOVE) ×2 IMPLANT
GLOVE SURG LTX SZ8 (GLOVE) ×2 IMPLANT
GLOVE SURG UNDER POLY LF SZ8 (GLOVE) ×4
GOWN STRL REUS W/ TWL LRG LVL3 (GOWN DISPOSABLE) ×1 IMPLANT
GOWN STRL REUS W/ TWL XL LVL3 (GOWN DISPOSABLE) ×2 IMPLANT
GOWN STRL REUS W/TWL LRG LVL3 (GOWN DISPOSABLE) ×2
GOWN STRL REUS W/TWL XL LVL3 (GOWN DISPOSABLE) ×4
NDL SUT 6 .5 CRC .975X.05 MAYO (NEEDLE) IMPLANT
NEEDLE HYPO 25X1 1.5 SAFETY (NEEDLE) IMPLANT
NEEDLE MAYO TAPER (NEEDLE)
PACK BASIN DAY SURGERY FS (CUSTOM PROCEDURE TRAY) ×2 IMPLANT
PAD CAST 4YDX4 CTTN HI CHSV (CAST SUPPLIES) ×1 IMPLANT
PADDING CAST ABS 4INX4YD NS (CAST SUPPLIES)
PADDING CAST ABS COTTON 4X4 ST (CAST SUPPLIES) IMPLANT
PADDING CAST COTTON 4X4 STRL (CAST SUPPLIES) ×2
PADDING CAST COTTON 6X4 STRL (CAST SUPPLIES) ×2 IMPLANT
PENCIL SMOKE EVACUATOR (MISCELLANEOUS) ×2 IMPLANT
SANITIZER HAND PURELL 535ML FO (MISCELLANEOUS) ×2 IMPLANT
SHEET MEDIUM DRAPE 40X70 STRL (DRAPES) ×2 IMPLANT
SLEEVE SCD COMPRESS KNEE MED (STOCKING) ×2 IMPLANT
SPLINT FAST PLASTER 5X30 (CAST SUPPLIES) ×20
SPLINT PLASTER CAST FAST 5X30 (CAST SUPPLIES) ×20 IMPLANT
SPONGE LAP 18X18 RF (DISPOSABLE) ×2 IMPLANT
STOCKINETTE 6  STRL (DRAPES) ×2
STOCKINETTE 6 STRL (DRAPES) ×1 IMPLANT
SUCTION FRAZIER HANDLE 10FR (MISCELLANEOUS)
SUCTION TUBE FRAZIER 10FR DISP (MISCELLANEOUS) IMPLANT
SUT ETHILON 3 0 PS 1 (SUTURE) ×4 IMPLANT
SUT FIBERWIRE #2 38 T-5 BLUE (SUTURE)
SUT MNCRL AB 3-0 PS2 18 (SUTURE) ×6 IMPLANT
SUT VIC AB 0 SH 27 (SUTURE) ×2 IMPLANT
SUT VIC AB 1 CT1 27 (SUTURE) ×10
SUT VIC AB 1 CT1 27XBRD ANBCTR (SUTURE) ×5 IMPLANT
SUT VIC AB 2-0 SH 27 (SUTURE)
SUT VIC AB 2-0 SH 27XBRD (SUTURE) IMPLANT
SUTURE FIBERWR #2 38 T-5 BLUE (SUTURE) IMPLANT
SUTURE TAPE 1.3 FIBERLOP 20 ST (SUTURE) IMPLANT
SUTURETAPE 1.3 FIBERLOOP 20 ST (SUTURE)
SYR BULB EAR ULCER 3OZ GRN STR (SYRINGE) ×2 IMPLANT
SYR CONTROL 10ML LL (SYRINGE) IMPLANT
TOWEL GREEN STERILE FF (TOWEL DISPOSABLE) ×4 IMPLANT
TUBE CONNECTING 20X1/4 (TUBING) ×2 IMPLANT
UNDERPAD 30X36 HEAVY ABSORB (UNDERPADS AND DIAPERS) ×2 IMPLANT

## 2020-05-18 NOTE — Anesthesia Preprocedure Evaluation (Signed)
Anesthesia Evaluation  Patient identified by MRN, date of birth, ID band Patient awake    Reviewed: Allergy & Precautions, NPO status , Patient's Chart, lab work & pertinent test results  Airway Mallampati: II  TM Distance: >3 FB Neck ROM: Full    Dental no notable dental hx.    Pulmonary neg pulmonary ROS,    Pulmonary exam normal breath sounds clear to auscultation       Cardiovascular hypertension, Pt. on medications negative cardio ROS Normal cardiovascular exam Rhythm:Regular Rate:Normal     Neuro/Psych negative neurological ROS  negative psych ROS   GI/Hepatic negative GI ROS, Neg liver ROS,   Endo/Other  negative endocrine ROS  Renal/GU negative Renal ROS  negative genitourinary   Musculoskeletal  (+) Arthritis , Osteoarthritis,    Abdominal (+) + obese,   Peds negative pediatric ROS (+)  Hematology negative hematology ROS (+)   Anesthesia Other Findings   Reproductive/Obstetrics negative OB ROS                             Anesthesia Physical Anesthesia Plan  ASA: II  Anesthesia Plan: General   Post-op Pain Management:  Regional for Post-op pain   Induction: Intravenous  PONV Risk Score and Plan: 2 and Ondansetron, Midazolam and Treatment may vary due to age or medical condition  Airway Management Planned: Oral ETT  Additional Equipment:   Intra-op Plan:   Post-operative Plan: Extubation in OR  Informed Consent: I have reviewed the patients History and Physical, chart, labs and discussed the procedure including the risks, benefits and alternatives for the proposed anesthesia with the patient or authorized representative who has indicated his/her understanding and acceptance.     Dental advisory given  Plan Discussed with: CRNA  Anesthesia Plan Comments:         Anesthesia Quick Evaluation

## 2020-05-18 NOTE — H&P (Signed)
Joel Barnes. is an 50 y.o. male.   Chief Complaint: Right ankle injury HPI: The patient is a 50 year old male without significant past medical history.  He injured his right ankle playing basketball with some teenagers at work over a month ago.  A recent MRI reveals a complete Achilles tendon rupture.  He presents now for operative treatment of this injury.  Past Medical History:  Diagnosis Date  . Achilles tendon rupture, right, initial encounter   . Arthritis    neck, right shoulder  . Hypertension   . Pre-diabetes   . Snores     Past Surgical History:  Procedure Laterality Date  . COLONOSCOPY W/ BIOPSIES AND POLYPECTOMY    . HERNIA REPAIR Left    IHR as baby    History reviewed. No pertinent family history. Social History:  reports that he has never smoked. He has never used smokeless tobacco. He reports current alcohol use. He reports that he does not use drugs.  Allergies:  Allergies  Allergen Reactions  . Penicillins     Medications Prior to Admission  Medication Sig Dispense Refill  . amLODipine (NORVASC) 5 MG tablet Take 5 mg by mouth daily.    Marland Kitchen aspirin EC 81 MG tablet Take 81 mg by mouth daily. Swallow whole.    Marland Kitchen atorvastatin (LIPITOR) 20 MG tablet Take 20 mg by mouth daily.    Marland Kitchen lisinopril-hydrochlorothiazide (ZESTORETIC) 20-25 MG tablet Take 1 tablet by mouth daily.    . meloxicam (MOBIC) 7.5 MG tablet Take 7.5 mg by mouth daily.    . sildenafil (VIAGRA) 50 MG tablet Take 50 mg by mouth daily as needed for erectile dysfunction.      Results for orders placed or performed during the hospital encounter of 05/18/20 (from the past 48 hour(s))  Basic metabolic panel per protocol     Status: Abnormal   Collection Time: 05/16/20 12:45 PM  Result Value Ref Range   Sodium 135 135 - 145 mmol/L   Potassium 4.1 3.5 - 5.1 mmol/L   Chloride 105 98 - 111 mmol/L   CO2 23 22 - 32 mmol/L   Glucose, Bld 123 (H) 70 - 99 mg/dL    Comment: Glucose reference range applies  only to samples taken after fasting for at least 8 hours.   BUN 14 6 - 20 mg/dL   Creatinine, Ser 0.98 0.61 - 1.24 mg/dL   Calcium 8.9 8.9 - 10.3 mg/dL   GFR, Estimated >60 >60 mL/min    Comment: (NOTE) Calculated using the CKD-EPI Creatinine Equation (2021)    Anion gap 7 5 - 15    Comment: Performed at [facility] [address] [place], [place] [address]   No results found.  Review of Systems no recent fever, chills, nausea, vomiting or changes in his appetite  Blood pressure 134/76, pulse 66, temperature (!) 97.5 F (36.4 C), temperature source Oral, resp. rate 16, height 5\' 10"  (1.778 m), weight 120.1 kg, SpO2 100 %. Physical Exam  Well-nourished well-developed male in no apparent distress.  Alert and oriented x4.  Normal mood and affect.  Gait is antalgic to the right.  The right ankle has a palpable defect in the tendon several centimeters proximal from the insertion of the Achilles on the calcaneus.  Skin is healthy and intact.  Pulses are palpable in the foot.  No lymphadenopathy.  4 out of 5 strength in plantarflexion.  Assessment/Plan Right Achilles tendon rupture with proximal gastrocnemius contracture -to the operating  room today for Achilles tendon repair and possibly gastrocnemius recession.  The risks and benefits of the alternative treatment options have been discussed in detail.  The patient wishes to proceed with surgery and specifically understands risks of bleeding, infection, nerve damage, blood clots, need for additional surgery, amputation and death.   Wylene Simmer, MD 05/27/20, 8:56 AM

## 2020-05-18 NOTE — Op Note (Signed)
05/18/2020  10:54 AM  PATIENT:  Joel Barnes.  50 y.o. male  PRE-OPERATIVE DIAGNOSIS: Chronic right Achilles tendon rupture with gastrocnemius contracture  POST-OPERATIVE DIAGNOSIS: Same  Procedure(s): 1.  right gastrocnemius recession 2.  Right Achilles tendon reconstruction with plantaris autograft   SURGEON:  Wylene Simmer, MD  ASSISTANT: Mechele Claude, PA-C  ANESTHESIA:   General, regional  EBL:  minimal   TOURNIQUET:   Total Tourniquet Time Documented: Thigh (Right) - 5 minutes Thigh (Right) - 57 minutes Total: Thigh (Right) - 62 minutes  COMPLICATIONS:  None apparent  DISPOSITION:  Extubated, awake and stable to recovery.  INDICATION FOR PROCEDURE: The patient is a 50 year old male who injured his right ankle playing basketball at work over 6 weeks ago.  He was found to have a chronic Achilles tendon rupture.  He presents now for Achilles tendon reconstruction.  He has a tight gastrocnemius and likely will need gastrocnemius recession as well.  The risks and benefits of the alternative treatment options have been discussed in detail.  The patient wishes to proceed with surgery and specifically understands risks of bleeding, infection, nerve damage, blood clots, need for additional surgery, amputation and death.  PROCEDURE IN DETAIL: After preoperative consent was obtained and the correct operative site was identified, the patient was brought the operating room supine on a stretcher.  General anesthesia was induced.  Preoperative antibiotics were administered.  Surgical timeout was taken.  The patient was then turned into the prone position on the operating table.  The right lower extremity was prepped and draped in standard sterile fashion.  The extremity was exsanguinated and the tourniquet was inflated to 300 mmHg.  A palpable defect was identified in the tendon.  The incision was made centered over this defect.  Dissection was carried sharply down through the subcutaneous  tissues and peritenon creating full-thickness flaps medially and laterally.  The stumps of the Achilles tendon rupture were identified.  Proximally the stump was scarred to the surrounding peritenon and densely adherent.  The peritenon was identified proximally.  Dissection was carried distally along the peritenon freeing the scar tissue of the tendon stump circumferentially.  The fibrous tissue was all sharply debrided with a rondure and scissors.  The plantaris tendon was identified.  It was traced proximally and released with an open and tendon stripper.  Distally the stump was freed from the peritenon.  The lateral aspect of the peritenon was somewhat friable.  The sural nerve branches were identified in the subcutaneous tissues.  They were protected throughout the case.  The deep fascia over the FHL was incised and released proximally and distally to allow easier closure.  A 0 Vicryl traction suture was placed in the proximal stump.  The tendon ends could not be approximated.  The decision was made to proceed with gastrocnemius recession.  A longitudinal incision was made over the gastrocnemius tendon.  Dissection was carried sharply down through the subcutaneous tissues taking care to protect the sural nerve and lesser saphenous vein.  Superficial fascia was incised.  The gastrocnemius tendon was identified.  It was incised and released medially and laterally under direct vision.  At this point the tendon ends could then be approximated.  #1 Vicryl Bunnell sutures were placed in the proximal stump leaving four strands of suture.  #1 Vicryl Bunnell sutures were placed in the distal stump in the same fashion again leaving for strands of suture.  The wound was irrigated copiously.  Vancomycin powder was sprinkled over the  FHL muscle in the deep portion of the wound.  The ankle was then plantarflexed approximating the tendon ends.  The sutures were tied in pair wise fashion.  After the repair was completed the  [facility] test was noted to be normal with appropriate resting tension of the gastrocnemius and soleus.  The plantaris tendon autograft was then woven through the proximal stump and then again through the distal stump and sewn into place with 3-0 Monocryl.  A 3-0 Monocryl running Silfverskiold stitch was placed circumferentially around the tendon.  Vancomycin powder was sprinkled over the superficial aspect of the tendon repair.  The peritenon was repaired with inverted simple sutures of 3-0 Monocryl.  The skin incision was closed with horizontal mattress sutures of 3-0 nylon.  Proximal incision was irrigated copiously.  The wound was sprinkled with vancomycin powder and closed with Monocryl and nylon.  Sterile dressings were applied followed by a well-padded short leg splint with the ankle in gravity equinus.  Tourniquet was released after application of the dressings.  The patient was awakened from anesthesia and transported to the recovery room in stable condition.  FOLLOW UP PLAN: Nonweightbearing on the right lower extremity in a short leg splint.  Follow-up in the office in 2 weeks for suture removal and conversion to a short leg cast.  Plan 6 weeks nonweightbearing immobilization slowly dorsiflexing the patient to neutral prior to going into a cam boot with 2 heel lifts.  Xarelto for DVT prophylaxis.     Mechele Claude PA-C was present and scrubbed for the duration of the operative case. His assistance was essential in positioning the patient, prepping and draping, gaining and maintaining exposure, performing the operation, closing and dressing the wounds and applying the splint.

## 2020-05-18 NOTE — Transfer of Care (Signed)
Immediate Anesthesia Transfer of Care Note  Patient: Joel Barnes.  Procedure(s) Performed: Right Achilles tendon repair (Right Leg Lower) GASTROC RECESSION EXTREMITY (Right Leg Lower)  Patient Location: PACU  Anesthesia Type:GA combined with regional for post-op pain  Level of Consciousness: awake, alert , oriented and patient cooperative  Airway & Oxygen Therapy: Patient Spontanous Breathing and Patient connected to face mask oxygen  Post-op Assessment: Report given to RN and Post -op Vital signs reviewed and stable  Post vital signs: Reviewed and stable  Last Vitals:  Vitals Value Taken Time  BP    Temp    Pulse    Resp    SpO2      Last Pain:  Vitals:   05/18/20 0839  TempSrc: Oral  PainSc: 0-No pain         Complications: No complications documented.

## 2020-05-18 NOTE — Discharge Instructions (Addendum)
Joel Simmer, MD [facility]  Please read the following information regarding your care after surgery.  Medications  You only need a prescription for the narcotic pain medicine (ex. oxycodone, Percocet, Norco).  All of the other medicines listed below are available over the counter. X Meloxicam that you have at home for the first 3 days after surgery. X acetominophen (Tylenol) 650 mg every 4-6 hours as you need for minor to moderate pain X oxycodone as prescribed for severe pain  Narcotic pain medicine (ex. oxycodone, Percocet, Vicodin) will cause constipation.  To prevent this problem, take the following medicines while you are taking any pain medicine. X docusate sodium (Colace) 100 mg twice a day X senna (Senokot) 2 tablets twice a day  X To help prevent blood clots, take Xarelto as prescribed for two weeks after surgery.  You should also get up every hour while you are awake to move around.    Weight Bearing X Do not bear any weight on the operated leg or foot.  Cast / Splint / Dressing X Keep your splint, cast or dressing clean and dry.  Don't put anything (coat hanger, pencil, etc) down inside of it.  If it gets damp, use a hair dryer on the cool setting to dry it.  If it gets soaked, call the office to schedule an appointment for a cast change.   After your dressing, cast or splint is removed; you may shower, but do not soak or scrub the wound.  Allow the water to run over it, and then gently pat it dry.  Swelling It is normal for you to have swelling where you had surgery.  To reduce swelling and pain, keep your toes above your nose for at least 3 days after surgery.  It may be necessary to keep your foot or leg elevated for several weeks.  If it hurts, it should be elevated.  Follow Up Call my office at [phone] when you are discharged from the hospital or surgery center to schedule an appointment to be seen two weeks after surgery.  Call my office at [phone] if you  develop a fever >101.5 F, nausea, vomiting, bleeding from the surgical site or severe pain.      NO TYLENOL PRODUCTS UNTIL 5:45 PM   Call your surgeon if you experience:   1.  Fever over 101.0. 2.  Inability to urinate. 3.  Nausea and/or vomiting. 4.  Extreme swelling or bruising at the surgical site. 5.  Continued bleeding from the incision. 6.  Increased pain, redness or drainage from the incision. 7.  Problems related to your pain medication. 8.  Any problems and/or concerns   Post Anesthesia Home Care Instructions  Activity: Get plenty of rest for the remainder of the day. A responsible individual must stay with you for 24 hours following the procedure.  For the next 24 hours, DO NOT: -Drive a car -Paediatric nurse -Drink alcoholic beverages -Take any medication unless instructed by your physician -Make any legal decisions or sign important papers.  Meals: Start with liquid foods such as gelatin or soup. Progress to regular foods as tolerated. Avoid greasy, spicy, heavy foods. If nausea and/or vomiting occur, drink only clear liquids until the nausea and/or vomiting subsides. Call your physician if vomiting continues.  Special Instructions/Symptoms: Your throat may feel dry or sore from the anesthesia or the breathing tube placed in your throat during surgery. If this causes discomfort, gargle with warm salt water. The discomfort should disappear within 24  hours.  If you had a scopolamine patch placed behind your ear for the management of post- operative nausea and/or vomiting:  1. The medication in the patch is effective for 72 hours, after which it should be removed.  Wrap patch in a tissue and discard in the trash. Wash hands thoroughly with soap and water. 2. You may remove the patch earlier than 72 hours if you experience unpleasant side effects which may include dry mouth, dizziness or visual disturbances. 3. Avoid touching the patch. Wash your hands with soap and  water after contact with the patch.      Regional Anesthesia Blocks  1. Numbness or the inability to move the "blocked" extremity may last from 3-48 hours after placement. The length of time depends on the medication injected and your individual response to the medication. If the numbness is not going away after 48 hours, call your surgeon.  2. The extremity that is blocked will need to be protected until the numbness is gone and the  Strength has returned. Because you cannot feel it, you will need to take extra care to avoid injury. Because it may be weak, you may have difficulty moving it or using it. You may not know what position it is in without looking at it while the block is in effect.  3. For blocks in the legs and feet, returning to weight bearing and walking needs to be done carefully. You will need to wait until the numbness is entirely gone and the strength has returned. You should be able to move your leg and foot normally before you try and bear weight or walk. You will need someone to be with you when you first try to ensure you do not fall and possibly risk injury.  4. Bruising and tenderness at the needle site are common side effects and will resolve in a few days.  5. Persistent numbness or new problems with movement should be communicated to the surgeon or the [facility] [phone] [place] [phone]).

## 2020-05-18 NOTE — Anesthesia Procedure Notes (Signed)
Anesthesia Regional Block: Popliteal block   Pre-Anesthetic Checklist: ,, timeout performed, Correct Patient, Correct Site, Correct Laterality, Correct Procedure, Correct Position, site marked, Risks and benefits discussed,  Surgical consent,  Pre-op evaluation,  At surgeon's request and post-op pain management  Laterality: Right  Prep: chloraprep       Needles:  Injection technique: Single-shot  Needle Type: Stimiplex     Needle Length: 9cm  Needle Gauge: 21     Additional Needles:   Procedures:,,,, ultrasound used (permanent image in chart),,,,  Narrative:  Start time: 05/18/2020 9:04 AM End time: 05/18/2020 9:09 AM Injection made incrementally with aspirations every 5 mL.  Performed by: Personally  Anesthesiologist: Lynda Rainwater, MD

## 2020-05-18 NOTE — Progress Notes (Signed)
Assisted Dr. Sabra Heck with right, ultrasound guided, popliteal block. Side rails up, monitors on throughout procedure. See vital signs in flow sheet. Tolerated Procedure well.

## 2020-05-18 NOTE — Anesthesia Postprocedure Evaluation (Signed)
Anesthesia Post Note  Patient: Joel Barnes.  Procedure(s) Performed: Right Achilles tendon repair (Right Leg Lower) GASTROC RECESSION EXTREMITY (Right Leg Lower)     Patient location during evaluation: PACU Anesthesia Type: General Level of consciousness: awake and alert Pain management: pain level controlled Vital Signs Assessment: post-procedure vital signs reviewed and stable Respiratory status: spontaneous breathing, nonlabored ventilation and respiratory function stable Cardiovascular status: blood pressure returned to baseline and stable Postop Assessment: no apparent nausea or vomiting Anesthetic complications: no   No complications documented.  Last Vitals:  Vitals:   05/18/20 1200 05/18/20 1211  BP: 132/87   Pulse:    Resp: 16 16  Temp:    SpO2: 100% 97%    Last Pain:  Vitals:   05/18/20 1211  TempSrc:   PainSc: [place]

## 2020-05-18 NOTE — Anesthesia Procedure Notes (Signed)
Procedure Name: Intubation Date/Time: 05/18/2020 9:20 AM Performed by: Signe Colt, CRNA Pre-anesthesia Checklist: Patient identified, Emergency Drugs available, Suction available and Patient being monitored Patient Re-evaluated:Patient Re-evaluated prior to induction Oxygen Delivery Method: Circle system utilized Preoxygenation: Pre-oxygenation with 100% oxygen Induction Type: IV induction Ventilation: Mask ventilation without difficulty Laryngoscope Size: Mac and 4 Grade View: Grade II Tube type: Oral Tube size: 7.5 mm Number of attempts: 1 Airway Equipment and Method: Stylet and Oral airway Placement Confirmation: ETT inserted through vocal cords under direct vision,  positive ETCO2 and breath sounds checked- equal and bilateral Secured at: 22 cm Tube secured with: Tape Dental Injury: Teeth and Oropharynx as per pre-operative assessment

## 2020-05-19 ENCOUNTER — Encounter (HOSPITAL_BASED_OUTPATIENT_CLINIC_OR_DEPARTMENT_OTHER): Payer: Self-pay | Admitting: Orthopedic Surgery

## 2021-10-14 IMAGING — DX DG TIBIA/FIBULA 2V*R*
4 series · 4 of 4 positions shown · non-contrast
Comparison: None.

CLINICAL DATA: Injury 03/24/2020. Pain in the right calf after
basketball

EXAM:
RIGHT TIBIA AND FIBULA - 2 VIEW

[tibia ap (1 of 2)]
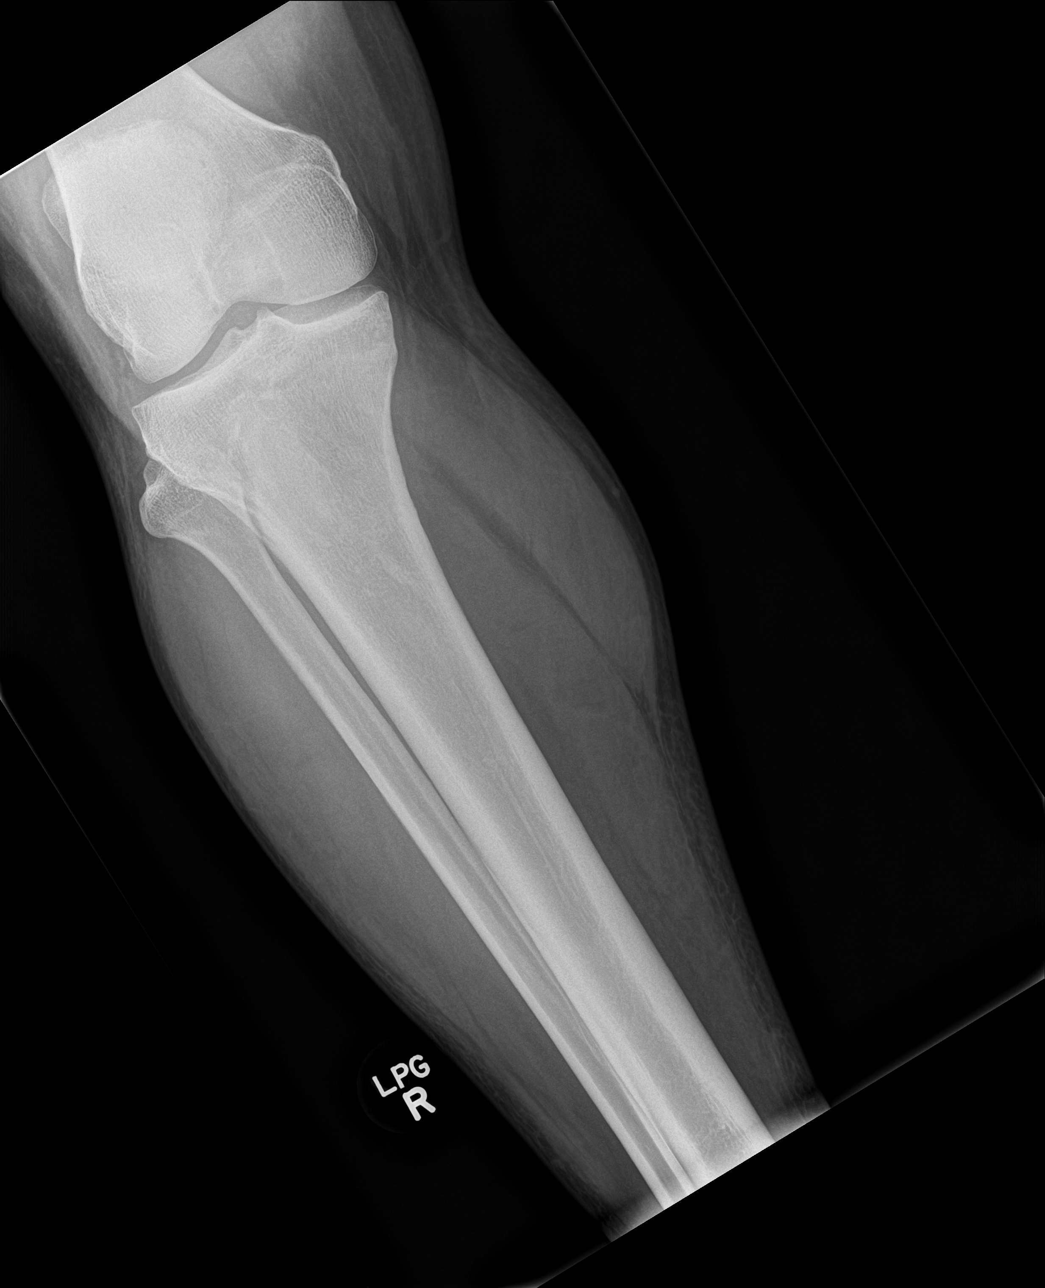

[tibia ap (2 of 2)]
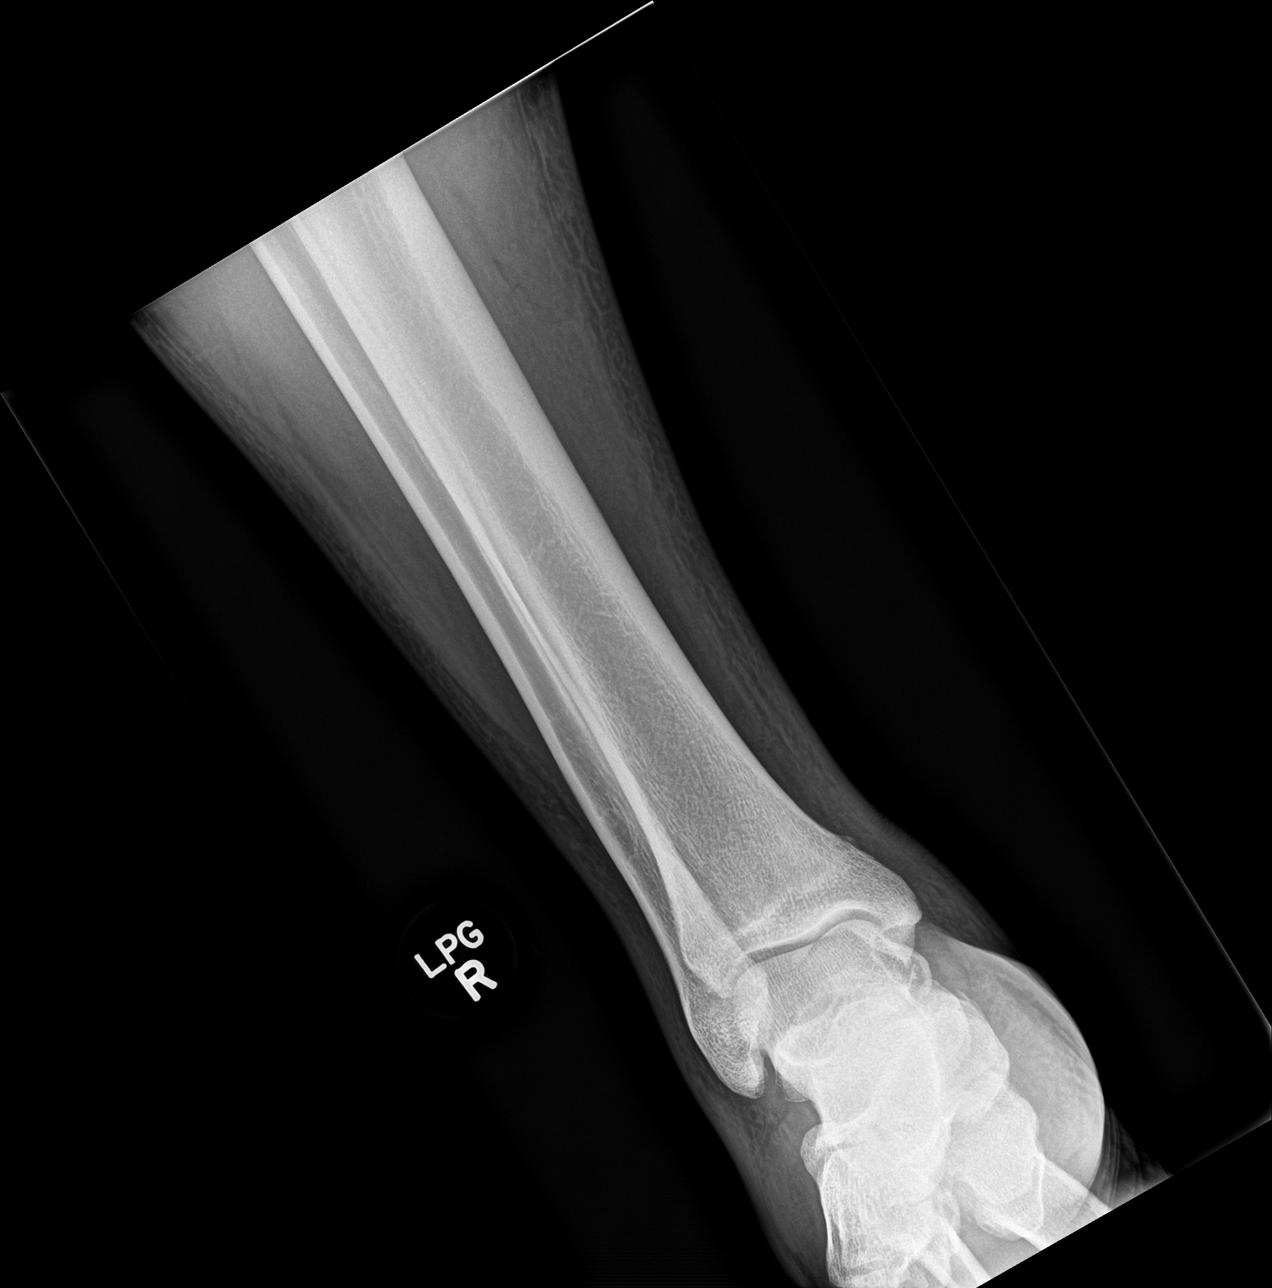

[tibia lat (1 of 2)]
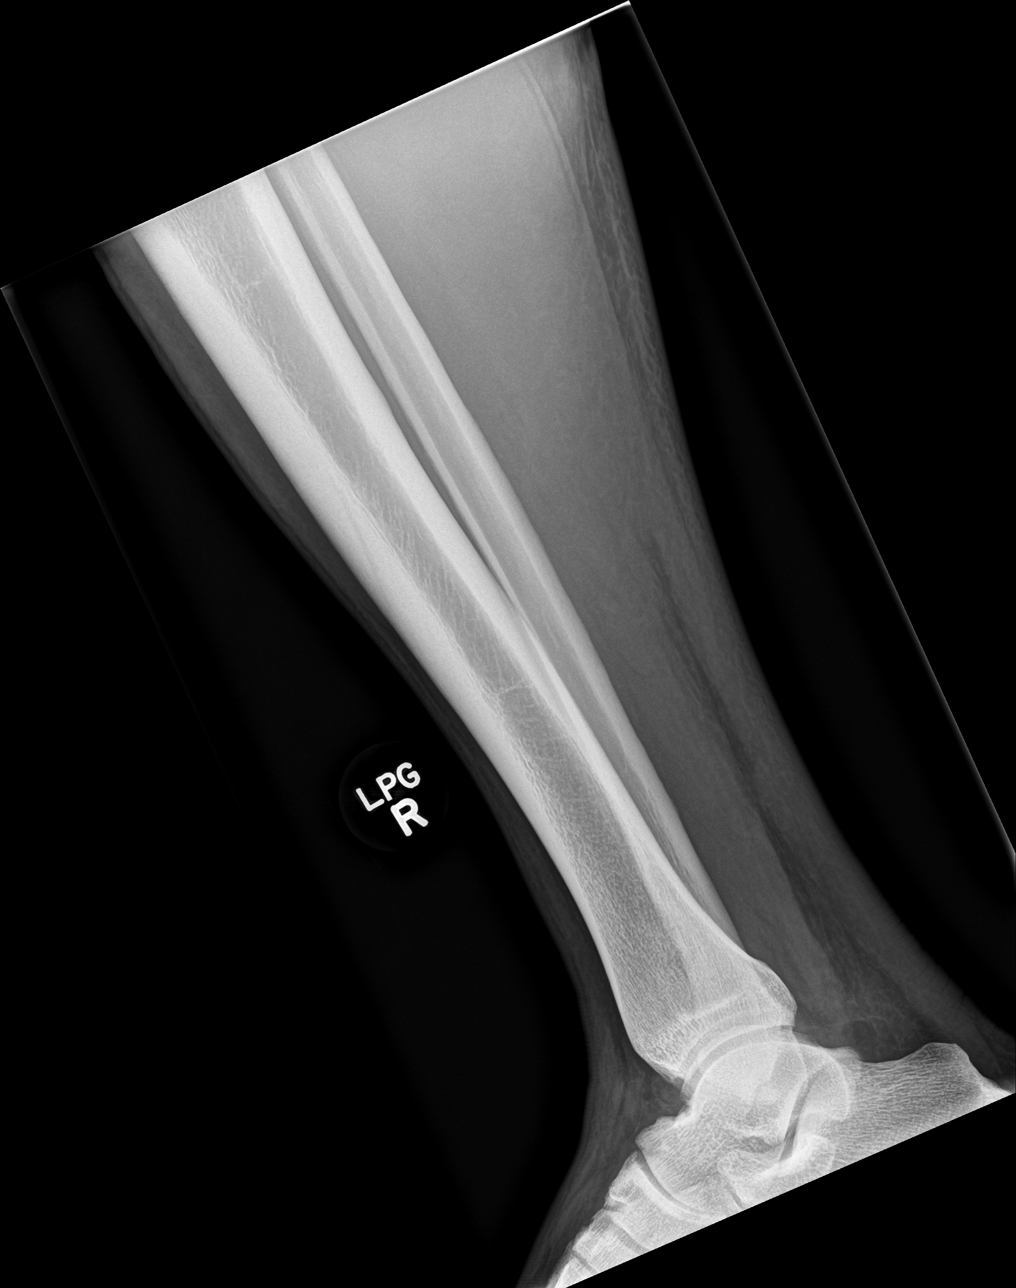

[tibia lat (2 of 2)]
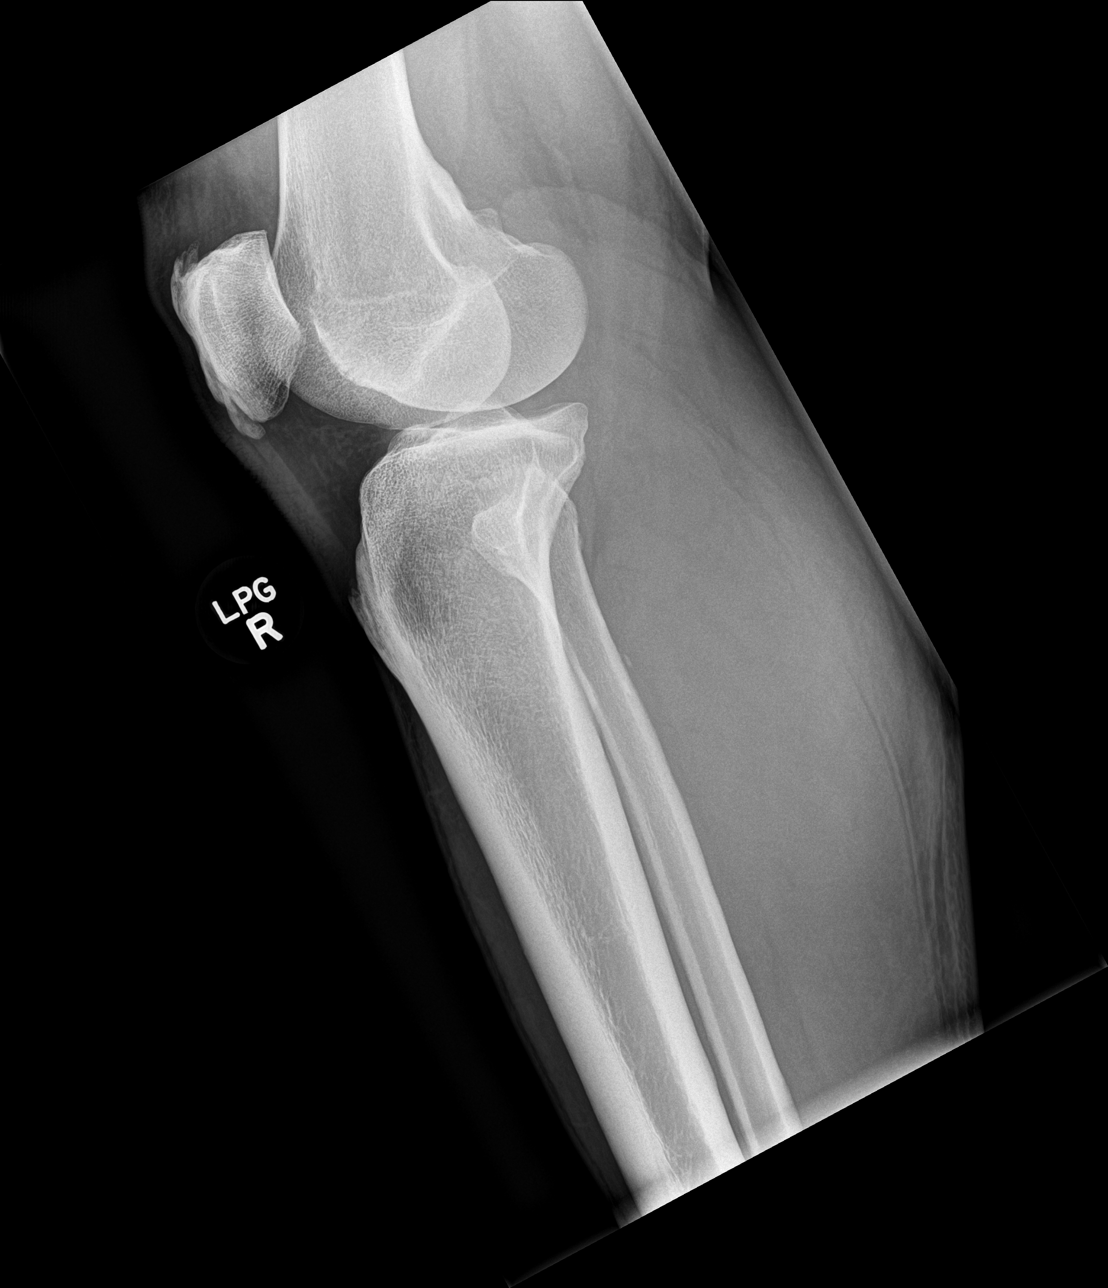

[4 of 4 positions shown; findings below may reference images not displayed]

FINDINGS: The appearance of linear lucency in the lateral tibial metaphysis
proximally is likely being caused by combination of the tibial
tubercle and fibular head rather than fracture. I not see evidence
of a knee effusion or proximal tibial fracture on the lateral
projection.

Right knee lateral compartmental narrowing, likely degenerative.

Subcutaneous edema in the calf. Mild marginal spurring of the
patella.
IMPRESSION: 1. The appearance of linear lucency in the lateral tibial metaphysis
proximally is likely being caused by margins of the tibial tubercle
and fibular head rather than a fracture. Correlate with point
tenderness in determining whether cross-sectional imaging assessment
is warranted.
2. Subcutaneous edema in the calf.

## 2022-12-04 NOTE — Progress Notes (Unsigned)
[facility]   [phone] [phone]   Clinic New consult Note   Patient Care Team: Georgiann Cocker, FNP as PCP - General (Family Medicine) 12/08/2022  CHIEF COMPLAINTS/PURPOSE OF CONSULTATION:  Blood clots   ASSESSMENT & PLAN:  Acute deep vein thrombosis (DVT) of brachial vein of right upper extremity [facility]) Assessment & Plan: Patient has history of unprovoked DVT of right brachial vein which required hospitalization in October 2023.  Has been on Xarelto ever since.  Primary care hesitant about discontinuing Xarelto without workup for coagulopathies. -Stop Xarelto for next 30 days. -Check coagulation studies 4 weeks after discontinuation of Xarelto. -If no abnormalities, will recommend he stay off Xarelto.  If positive for abnormalities will restart Xarelto indefinitely. -If able to stop Xarelto, patient understands if second unprovoked DVT occurs, he will be on Xarelto for life. -Follow-up with patient via telephone visit after labs have been returned.  Orders: -     CBC with Differential (Cancer Center Only); Future -     APTT; Future -     Protime-INR; Future -     Antithrombin III antigen; Future -     Factor V Leiden; Future -     Fibrinogen; Future -     Protein C activity; Future -     PROTEIN S PANEL; Future -     D-dimer, quantitative; Future      HISTORY OF PRESENTING ILLNESS:  Joel Barnes. 52 y.o. male is here because of unprovoked blood clot in right subclavian vein.  Presented to emergency department with right arm weakness and pain.  Concern of TIA.  When nothing was found, ultrasound was done of right arm finding DVT and brachial vein.  He has been on Xarelto since initial diagnosis.  Clot was unprovoked.  No injury, surgery, procedure, or inflammation prior to this event.  Was initially started on Lovenox and transition to Xarelto successfully.  Has gradually gained back strength of right arm, but still not back to baseline.   Primary care concerned about patient coming off Eliquis without looking up for coagulopathy.  He does have history of hypertension which is controlled with medications.  He is otherwise in good health.  Denies family history of bleeding disorders or coagulopathies.  He is a non-smoker.  Drinks only socially.  Does not use any illicit or illegal drugs.  Eats a healthy, well-balanced diet.   He denies recent chest pain on exertion, shortness of breath on minimal exertion, pre-syncopal episodes, or palpitations. He had not noticed any recent bleeding such as epistaxis, hematuria or hematochezia The patient denies over the counter NSAID ingestion. He is not  on antiplatelets agents. He had no prior history or diagnosis of cancer. His age appropriate screening programs are up-to-date. He denies any pica and eats a variety of diet. He never donated blood or received blood transfusion    REVIEW OF SYSTEMS:   Constitutional: Denies fevers, chills or abnormal night sweats Eyes: Denies blurriness of vision, double vision or watery eyes Ears, nose, mouth, throat, and face: Denies mucositis or sore throat Respiratory: Denies cough, dyspnea or wheezes Cardiovascular: Denies palpitation, chest discomfort or lower extremity swelling Gastrointestinal:  Denies nausea, heartburn or change in bowel habits Skin: Denies abnormal skin rashes Lymphatics: Denies new lymphadenopathy or easy bruising Neurological:Denies numbness, tingling or new weaknesses Behavioral/Psych: Mood is stable, no new changes   All other systems were reviewed with the patient and are negative.   MEDICAL HISTORY:  Past Medical  History:  Diagnosis Date   Achilles tendon rupture, right, initial encounter    Arthritis    neck, right shoulder   Hypertension    Pre-diabetes    Snores     SURGICAL HISTORY: Past Surgical History:  Procedure Laterality Date   ACHILLES TENDON SURGERY Right 05/18/2020   Procedure: Right Achilles  tendon repair;  Surgeon: Toni Arthurs, MD;  Location: [facility];  Service: Orthopedics;  Laterality: Right;    COLONOSCOPY W/ BIOPSIES AND POLYPECTOMY     GASTROC RECESSION EXTREMITY Right 05/18/2020   Procedure: GASTROC RECESSION EXTREMITY;  Surgeon: Toni Arthurs, MD;  Location: [facility];  Service: Orthopedics;  Laterality: Right;   HERNIA REPAIR Left    IHR as baby    SOCIAL HISTORY: Social History   Socioeconomic History   Marital status: Married    Spouse name: Not on file   Number of children: Not on file   Years of education: Not on file   Highest education level: Not on file  Occupational History   Not on file  Tobacco Use   Smoking status: Never   Smokeless tobacco: Never  Substance and Sexual Activity   Alcohol use: Yes    Comment: social   Drug use: Never   Sexual activity: Yes  Other Topics Concern   Not on file  Social History Narrative   Not on file   Social Determinants of Health   Financial Resource Strain: Low Risk  (11/13/2021)   [facility]   Overall Financial Resource Strain (CARDIA)    Difficulty of Paying Living Expenses: Not very hard  Food Insecurity: No Food Insecurity (12/05/2022)   Hunger Vital Sign    Worried About Running Out of Food in the Last Year: Never true    Ran Out of Food in the Last Year: Never true  Transportation Needs: No Transportation Needs (12/05/2022)   PRAPARE - Administrator, Civil Service (Medical): No    Lack of Transportation (Non-Medical): No  Physical Activity: Not on file  Stress: No Stress Concern Present (11/13/2021)   Received from [facility] - Occupational Stress Questionnaire    Feeling of Stress : Not at all  Social Connections: Unknown (11/13/2021)   Received from [facility]   Social Network    Social Network: Not on file  Intimate Partner Violence: Not At Risk (12/05/2022)   Humiliation,  Afraid, Rape, and Kick questionnaire    Fear of Current or Ex-Partner: No    Emotionally Abused: No    Physically Abused: No    Sexually Abused: No    FAMILY HISTORY: No family history on file.  ALLERGIES:  is allergic to penicillins.  MEDICATIONS:  Current Outpatient Medications  Medication Sig Dispense Refill   amLODipine (NORVASC) 5 MG tablet Take 5 mg by mouth daily.     atorvastatin (LIPITOR) 20 MG tablet Take 20 mg by mouth daily.     ELIQUIS 5 MG TABS tablet Take 5 mg by mouth 2 (two) times daily.     lisinopril-hydrochlorothiazide (ZESTORETIC) 20-25 MG tablet Take 1 tablet by mouth daily.     No current facility-administered medications for this visit.    PHYSICAL EXAMINATION: ECOG PERFORMANCE STATUS: 1 - Symptomatic but completely ambulatory  Vitals:   12/05/22 1405  BP: 130/74  Pulse: 82  Resp: 17  Temp: 97.9 F (36.6 C)  SpO2: 100%   Filed Weights  12/05/22 1405  Weight: 261 lb 12.8 oz (118.8 kg)    GENERAL:alert, no distress and comfortable SKIN: skin color, texture, turgor are normal, no rashes or significant lesions EYES: normal, conjunctiva are pink and non-injected, sclera clear OROPHARYNX:no exudate, no erythema and lips, buccal mucosa, and tongue normal  NECK: supple, thyroid normal size, non-tender, without nodularity LYMPH:  no palpable lymphadenopathy in the cervical, axillary or inguinal LUNGS: clear to auscultation and percussion with normal breathing effort HEART: regular rate & rhythm and no murmurs and no lower extremity edema ABDOMEN:abdomen soft, non-tender and normal bowel sounds Musculoskeletal:no cyanosis of digits and no clubbing.  Grip strength is equal bilaterally. PSYCH: alert & oriented x 3 with fluent speech NEURO: no focal motor/sensory deficits  LABORATORY DATA:  I have reviewed the data as listed    10/24/2006   12:33 PM  CBC  Hemoglobin 16.0   Hematocrit 47.0        Latest Ref Rng & Units 05/16/2020   12:45 PM  10/24/2006   12:34 PM 10/24/2006   12:33 PM  CMP  Glucose 70 - 99 mg/dL 630   95   BUN 6 - 20 mg/dL 14   14   Creatinine 1.60 - 1.24 mg/dL 1.09  1.1    Sodium 323 - 145 mmol/L 135   138   Potassium 3.5 - 5.1 mmol/L 4.1   4.7   Chloride 98 - 111 mmol/L 105   108   CO2 22 - 32 mmol/L 23     Calcium 8.9 - 10.3 mg/dL 8.9        All questions were answered. The patient knows to call the clinic with any problems, questions or concerns. The total time spent in the appointment was 30 minutes.     Carlean Jews, NP 12/08/2022 5:32 PM   Addendum I have seen the patient, examined him. I agree with the assessment and and plan and have edited the notes.   52 year old male with past medical history of hypertension and prediabetes, was referred for right upper extremity DVT in October 2023.  It was unprovoked, first episode, no family history of thrombosis.  No smoking, hormone replacement etc.  He has been on anticoagulation for over 1 year, symptoms mostly resolved.  I think it is reasonable to stop anticoagulation, and start aspirin 81 mg daily to reduce his future risk of thrombosis.  Given his young age and unprovoked episodes, I recommend hypercoagulopathy workup 1 months after he comes off Xarelto.  Will also check D-dimer.  All questions were answered.  Will call him with lab results.  I spent a total of 30 minutes for his visit today, more than 50% time on face-to-face counseling.  Malachy Mood MD 12/05/2022

## 2022-12-05 ENCOUNTER — Inpatient Hospital Stay: Payer: 59 | Attending: Nurse Practitioner | Admitting: Nurse Practitioner

## 2022-12-05 ENCOUNTER — Inpatient Hospital Stay: Payer: 59

## 2022-12-05 ENCOUNTER — Encounter: Payer: Self-pay | Admitting: Nurse Practitioner

## 2022-12-05 VITALS — BP 130/74 | HR 82 | Temp 97.9°F | Resp 17 | Wt 261.8 lb

## 2022-12-05 DIAGNOSIS — R7303 Prediabetes: Secondary | ICD-10-CM | POA: Insufficient documentation

## 2022-12-05 DIAGNOSIS — Z86718 Personal history of other venous thrombosis and embolism: Secondary | ICD-10-CM | POA: Diagnosis present

## 2022-12-05 DIAGNOSIS — Z7901 Long term (current) use of anticoagulants: Secondary | ICD-10-CM | POA: Diagnosis not present

## 2022-12-05 DIAGNOSIS — I82621 Acute embolism and thrombosis of deep veins of right upper extremity: Secondary | ICD-10-CM

## 2022-12-05 DIAGNOSIS — I10 Essential (primary) hypertension: Secondary | ICD-10-CM | POA: Diagnosis not present

## 2022-12-08 DIAGNOSIS — Z86718 Personal history of other venous thrombosis and embolism: Secondary | ICD-10-CM | POA: Insufficient documentation

## 2022-12-08 DIAGNOSIS — I82621 Acute embolism and thrombosis of deep veins of right upper extremity: Secondary | ICD-10-CM | POA: Insufficient documentation

## 2022-12-08 NOTE — Assessment & Plan Note (Signed)
Patient has history of unprovoked DVT of right brachial vein which required hospitalization in October 2023.  Has been on Xarelto ever since.  Primary care hesitant about discontinuing Xarelto without workup for coagulopathies. -Stop Xarelto for next 30 days. -Check coagulation studies 4 weeks after discontinuation of Xarelto. -If no abnormalities, will recommend he stay off Xarelto.  If positive for abnormalities will restart Xarelto indefinitely. -If able to stop Xarelto, patient understands if second unprovoked DVT occurs, he will be on Xarelto for life. -Follow-up with patient via telephone visit after labs have been returned.

## 2022-12-19 ENCOUNTER — Ambulatory Visit
Admission: RE | Admit: 2022-12-19 | Discharge: 2022-12-19 | Disposition: A | Discharge status: Home / Self Care | Payer: 59 | Source: Ambulatory Visit | Attending: Family Medicine | Admitting: Family Medicine

## 2022-12-19 ENCOUNTER — Other Ambulatory Visit: Payer: Self-pay | Admitting: Family Medicine

## 2022-12-19 DIAGNOSIS — R52 Pain, unspecified: Secondary | ICD-10-CM

## 2023-01-29 ENCOUNTER — Other Ambulatory Visit: Payer: Self-pay

## 2023-01-29 DIAGNOSIS — I82621 Acute embolism and thrombosis of deep veins of right upper extremity: Secondary | ICD-10-CM

## 2023-01-30 ENCOUNTER — Inpatient Hospital Stay: Payer: 59 | Attending: Nurse Practitioner

## 2023-01-30 DIAGNOSIS — Z7901 Long term (current) use of anticoagulants: Secondary | ICD-10-CM | POA: Insufficient documentation

## 2023-01-30 DIAGNOSIS — Z86718 Personal history of other venous thrombosis and embolism: Secondary | ICD-10-CM | POA: Insufficient documentation

## 2023-02-05 NOTE — Assessment & Plan Note (Deleted)
-  Patient had right upper extremity DVT in October 2023, unprovoked.  No other risk factors identified. -He underwent hypercoagulopathy workup

## 2023-02-06 ENCOUNTER — Ambulatory Visit: Payer: 59

## 2023-02-06 ENCOUNTER — Telehealth: Payer: Self-pay | Admitting: Hematology

## 2023-02-06 ENCOUNTER — Inpatient Hospital Stay: Payer: 59 | Admitting: Hematology

## 2023-02-06 ENCOUNTER — Telehealth: Payer: Self-pay | Admitting: *Deleted

## 2023-02-06 DIAGNOSIS — I82621 Acute embolism and thrombosis of deep veins of right upper extremity: Secondary | ICD-10-CM

## 2023-02-06 NOTE — Telephone Encounter (Signed)
Telephone call to patient. He has discontinued Eliquis mid December. Medication marked not taking on medication list. As of this note it has been about a month. He will come today for labs as requested in last office visit. Lab appointment scheduled. Patient will be called to reschedule his office visit via telephone visit to discuss lab results.

## 2023-02-06 NOTE — Telephone Encounter (Signed)
 Marland Kitchen

## 2023-02-11 ENCOUNTER — Telehealth: Payer: 59 | Admitting: Hematology

## 2023-02-11 ENCOUNTER — Telehealth: Payer: Self-pay | Admitting: Hematology

## 2023-02-11 NOTE — Telephone Encounter (Signed)
Joel Barnes

## 2023-02-13 ENCOUNTER — Telehealth: Payer: 59 | Admitting: Hematology

## 2023-02-13 ENCOUNTER — Inpatient Hospital Stay: Payer: 59

## 2023-02-13 DIAGNOSIS — I82621 Acute embolism and thrombosis of deep veins of right upper extremity: Secondary | ICD-10-CM

## 2023-02-13 DIAGNOSIS — Z7901 Long term (current) use of anticoagulants: Secondary | ICD-10-CM | POA: Diagnosis not present

## 2023-02-13 DIAGNOSIS — Z86718 Personal history of other venous thrombosis and embolism: Secondary | ICD-10-CM | POA: Diagnosis present

## 2023-02-13 LAB — CBC WITH DIFFERENTIAL (CANCER CENTER ONLY)
Abs Immature Granulocytes: 0.01 10*3/uL (ref 0.00–0.07)
Basophils Absolute: 0 10*3/uL (ref 0.0–0.1)
Basophils Relative: 1 %
Eosinophils Absolute: 0.1 10*3/uL (ref 0.0–0.5)
Eosinophils Relative: 2 %
HCT: 42.1 % (ref 39.0–52.0)
Hemoglobin: 14.1 g/dL (ref 13.0–17.0)
Immature Granulocytes: 0 %
Lymphocytes Relative: 49 %
Lymphs Abs: 2 10*3/uL (ref 0.7–4.0)
MCH: 28.1 pg (ref 26.0–34.0)
MCHC: 33.5 g/dL (ref 30.0–36.0)
MCV: 84 fL (ref 80.0–100.0)
Monocytes Absolute: 0.2 10*3/uL (ref 0.1–1.0)
Monocytes Relative: 6 %
Neutro Abs: 1.7 10*3/uL (ref 1.7–7.7)
Neutrophils Relative %: 42 %
Platelet Count: 220 10*3/uL (ref 150–400)
RBC: 5.01 MIL/uL (ref 4.22–5.81)
RDW: 13.7 % (ref 11.5–15.5)
WBC Count: 4.1 10*3/uL (ref 4.0–10.5)
nRBC: 0 % (ref 0.0–0.2)

## 2023-02-13 LAB — PROTIME-INR
INR: 1 (ref 0.8–1.2)
Prothrombin Time: 13.4 s (ref 11.4–15.2)

## 2023-02-13 LAB — CMP (CANCER CENTER ONLY)
ALT: 36 U/L (ref 0–44)
AST: 22 U/L (ref 15–41)
Albumin: 4.5 g/dL (ref 3.5–5.0)
Alkaline Phosphatase: 55 U/L (ref 38–126)
Anion gap: 6 (ref 5–15)
BUN: 15 mg/dL (ref 6–20)
CO2: 26 mmol/L (ref 22–32)
Calcium: 8.9 mg/dL (ref 8.9–10.3)
Chloride: 107 mmol/L (ref 98–111)
Creatinine: 1.29 mg/dL — ABNORMAL HIGH (ref 0.61–1.24)
GFR, Estimated: >60 mL/min (ref >60)
Glucose, Bld: 97 mg/dL (ref 70–99)
Potassium: 3.8 mmol/L (ref 3.5–5.1)
Sodium: 139 mmol/L (ref 135–145)
Total Bilirubin: 0.4 mg/dL (ref 0.0–1.2)
Total Protein: 7.2 g/dL (ref 6.5–8.1)

## 2023-02-13 LAB — D-DIMER, QUANTITATIVE: D-Dimer, Quant: <0.27 ug{FEU}/mL (ref 0.00–0.50)

## 2023-02-13 LAB — APTT: aPTT: 26 s (ref 24–36)

## 2023-02-13 LAB — FIBRINOGEN: Fibrinogen: 255 mg/dL (ref 210–475)

## 2023-02-14 LAB — ANTITHROMBIN III ANTIGEN: AT III AG PPP IMM-ACNC: 80 % (ref 72–124)

## 2023-02-14 LAB — PROTEIN S PANEL
Protein S Activity: 136 % (ref 63–140)
Protein S Ag, Free: 142 % — ABNORMAL HIGH (ref 61–136)
Protein S Ag, Total: 77 % (ref 60–150)

## 2023-02-14 LAB — PROTEIN C ACTIVITY: Protein C Activity: 110 % (ref 73–180)

## 2023-02-18 ENCOUNTER — Telehealth: Payer: 59 | Admitting: Hematology

## 2023-02-25 NOTE — Assessment & Plan Note (Signed)
Patient has history of unprovoked DVT of right brachial vein which required hospitalization in October 2023.  -No family history of thrombosis -He was on Xarelto for over a year, I stopped when I saw her in November 2024, and started her on baby aspirin. -I reviewed her coagulopathy workup from January 2025, which was negative, D-dimer was not elevated. -Continue aspirin 81 mg daily.

## 2023-02-26 ENCOUNTER — Inpatient Hospital Stay: Payer: 59 | Attending: Nurse Practitioner | Admitting: Hematology

## 2023-02-26 ENCOUNTER — Other Ambulatory Visit: Payer: Self-pay

## 2023-02-26 ENCOUNTER — Encounter: Payer: Self-pay | Admitting: Hematology

## 2023-02-26 DIAGNOSIS — Z86718 Personal history of other venous thrombosis and embolism: Secondary | ICD-10-CM

## 2023-02-26 DIAGNOSIS — I82621 Acute embolism and thrombosis of deep veins of right upper extremity: Secondary | ICD-10-CM

## 2023-02-26 NOTE — Progress Notes (Signed)
[facility]   [phone] [phone]   Clinic Follow up Note   Patient Care Team: Georgiann Cocker, FNP as PCP - General (Family Medicine) 02/26/2023  I connected with Joel Barnes. on 02/26/23 at  8:40 AM EST by telephone and verified that I am speaking with the correct person using two identifiers.   I discussed the limitations, risks, security and privacy concerns of performing an evaluation and management service by telephone and the availability of in person appointments. I also discussed with the patient that there may be a patient responsible charge related to this service. The patient expressed understanding and agreed to proceed.   Patient's location:  work  Provider's location:  Office    CHIEF COMPLAINT: Reviewed lab results   CURRENT THERAPY: Aspirin 81 mg daily  Oncology history History of DVT (deep vein thrombosis) Patient has history of unprovoked DVT of right brachial vein which required hospitalization in October 2023.  -No family history of thrombosis -He was on Xarelto for over a year, I stopped when I saw her in November 2024, and started her on baby aspirin. -I reviewed her coagulopathy workup from January 2025, which was negative, D-dimer was not elevated. -Continue aspirin 81 mg daily.   Assessment and Plan    Deep Vein Thrombosis (DVT) Previously on Eliquis, now discontinued. Recent labs, including genetic testing and D-dimer, were mostly negative, indicating low risk of future clots. Protein S was slightly elevated but not clinically significant. Factor V Leiden test was not completed. Explained that elevated protein S is not a concern, and the negative D-dimer indicates a low risk of future clots. Lifelong anticoagulation is not necessary unless another episode occurs. Recommended baby aspirin and staying active as preventive measures. - Recommend daily baby aspirin - Encourage regular physical activity - Check with lab  regarding missing Factor V Leiden test - If Factor V Leiden test was missed, schedule for completion.      Plan -Lab results reviewed, negative -I called the lab, factor V Leiden mutation was not done, will call patient back to obtain that test.  If this is negative, I will see him as needed. -Continue baby aspirin    Discussed the use of AI scribe software for clinical note transcription with the patient, who gave verbal consent to proceed.  History of Present Illness   Joel Barnes, a 53 year old with a history of DVT, had been on Eliquis, a blood thinner, but discontinued it after the last consultation. The patient underwent a series of blood tests on January 30th to assess the risk of future blood clots. The results, which the patient had already seen, were mostly negative. The only abnormality was a slightly elevated protein S level, which is not a concern as low levels are usually problematic. The D-dimer test, a marker for future blood clot risk, came back negative. The patient was not experiencing any symptoms at the time of the call and was at work.         REVIEW OF SYSTEMS:   Constitutional: Denies fevers, chills or abnormal weight loss Eyes: Denies blurriness of vision Ears, nose, mouth, throat, and face: Denies mucositis or sore throat Respiratory: Denies cough, dyspnea or wheezes Cardiovascular: Denies palpitation, chest discomfort or lower extremity swelling Gastrointestinal:  Denies nausea, heartburn or change in bowel habits Skin: Denies abnormal skin rashes Lymphatics: Denies new lymphadenopathy or easy bruising Neurological:Denies numbness, tingling or new weaknesses Behavioral/Psych: Mood is stable, no new changes  All other  systems were reviewed with the patient and are negative.  MEDICAL HISTORY:  Past Medical History:  Diagnosis Date   Achilles tendon rupture, right, initial encounter    Arthritis    neck, right shoulder   Hypertension    Pre-diabetes     Snores     SURGICAL HISTORY: Past Surgical History:  Procedure Laterality Date   ACHILLES TENDON SURGERY Right 05/18/2020   Procedure: Right Achilles tendon repair;  Surgeon: Toni Arthurs, MD;  Location: [facility];  Service: Orthopedics;  Laterality: Right;    COLONOSCOPY W/ BIOPSIES AND POLYPECTOMY     GASTROC RECESSION EXTREMITY Right 05/18/2020   Procedure: GASTROC RECESSION EXTREMITY;  Surgeon: Toni Arthurs, MD;  Location:  SURGERY CENTER;  Service: Orthopedics;  Laterality: Right;   HERNIA REPAIR Left    IHR as baby    I have reviewed the social history and family history with the patient and they are unchanged from previous note.  ALLERGIES:  is allergic to penicillins.  MEDICATIONS:  Current Outpatient Medications  Medication Sig Dispense Refill   amLODipine (NORVASC) 5 MG tablet Take 5 mg by mouth daily.     atorvastatin (LIPITOR) 20 MG tablet Take 20 mg by mouth daily.     ELIQUIS 5 MG TABS tablet Take 5 mg by mouth 2 (two) times daily. (Patient not taking: Reported on 02/06/2023)     lisinopril-hydrochlorothiazide (ZESTORETIC) 20-25 MG tablet Take 1 tablet by mouth daily.     No current facility-administered medications for this visit.    PHYSICAL EXAMINATION: Not performed   LABORATORY DATA:  I have reviewed the data as listed    Latest Ref Rng & Units 02/13/2023    2:20 PM 10/24/2006   12:33 PM  CBC  WBC 4.0 - 10.5 K/uL 4.1    Hemoglobin 13.0 - 17.0 g/dL 27.2  53.6   Hematocrit 39.0 - 52.0 % 42.1  47.0   Platelets 150 - 400 K/uL 220          Latest Ref Rng & Units 02/13/2023    2:20 PM 05/16/2020   12:45 PM 10/24/2006   12:34 PM  CMP  Glucose 70 - 99 mg/dL 97  644    BUN 6 - 20 mg/dL 15  14    Creatinine 0.34 - 1.24 mg/dL 7.42  5.95  1.1   Sodium 135 - 145 mmol/L 139  135    Potassium 3.5 - 5.1 mmol/L 3.8  4.1    Chloride 98 - 111 mmol/L 107  105    CO2 22 - 32 mmol/L 26  23    Calcium 8.9 - 10.3 mg/dL 8.9  8.9    Total  Protein 6.5 - 8.1 g/dL 7.2     Total Bilirubin 0.0 - 1.2 mg/dL 0.4     Alkaline Phos 38 - 126 U/L 55     AST 15 - 41 U/L 22     ALT 0 - 44 U/L 36         RADIOGRAPHIC STUDIES: I have personally reviewed the radiological images as listed and agreed with the findings in the report. No results found.     I discussed the assessment and treatment plan with the patient. The patient was provided an opportunity to ask questions and all were answered. The patient agreed with the plan and demonstrated an understanding of the instructions.   The patient was advised to call back or seek an in-person evaluation if the symptoms worsen or  if the condition fails to improve as anticipated.  I provided 13 minutes of non face-to-face telephone visit time during this encounter, and > 50% was spent counseling as documented under my assessment & plan.     Malachy Mood, MD 02/26/23

## 2023-03-18 LAB — LAB REPORT - SCANNED
A1c: 6.2
Calcium: 9.6
EGFR: 67

## 2023-05-05 ENCOUNTER — Encounter: Attending: Family Medicine | Admitting: Dietician

## 2023-05-05 ENCOUNTER — Encounter: Payer: Self-pay | Admitting: Dietician

## 2023-05-05 DIAGNOSIS — E119 Type 2 diabetes mellitus without complications: Secondary | ICD-10-CM | POA: Insufficient documentation

## 2023-05-05 NOTE — Progress Notes (Signed)
 Diabetes Self-Management Education  Visit Type: First/Initial  Appt. Start Time: 1120 Appt. End Time: 1230  05/05/2023  [name], identified by name and date of birth, is a 53 y.o. male with a diagnosis of Diabetes: Type 2.   ASSESSMENT Pt reports desire to control A1c with dietary and lifestyle changes, wants to avoid medication. Pt reports starting metformin after diagnosis, experienced significant GI side effects and d/c medication. Pt reports trial period of Rybelsus, no side effects, but trial dosage has since run out. Pt reports making dietary changes recently, smaller portions of starches, not eating ice cream at night, cut back on potatoes. Pt reports doing most of the cooking in their household and feels confident they will be able to adhere to dietary changes. Pt reports having glucometer, but doesn't know how to check glucose. SMBG education provided, CBG - 114 mg/dL during visit, pt reports eating small breakfast ~2-3 hours prior. Pt reports having access to gym, wants to go and walk treadmill or do elliptical for exercise. Pt states they walk at work, but are not very active otherwise.   Diabetes Self-Management Education - 05/05/23 1133       Visit Information   Visit Type First/Initial      Initial Visit   Diabetes Type Type 2    Date Diagnosed 3-4 months ago    Are you taking your medications as prescribed? Yes      Health Coping   How would you rate your overall health? Good      Psychosocial Assessment   Patient Belief/Attitude about Diabetes Motivated to manage diabetes    What is the hardest part about your diabetes right now, causing you the most concern, or is the most worrisome to you about your diabetes?   Making healty food and beverage choices    Self-care barriers None    Self-management support Doctor's office    Other persons present Patient    Patient Concerns Nutrition/Meal planning;Glycemic Control    Special Needs None    Preferred Learning  Style No preference indicated    Learning Readiness Ready    How often do you need to have someone help you when you read instructions, pamphlets, or other written materials from your doctor or pharmacy? 1 - Never    What is the last grade level you completed in school? Bachelors      Pre-Education Assessment   Patient understands the diabetes disease and treatment process. Needs Instruction    Patient understands incorporating nutritional management into lifestyle. Needs Instruction    Patient undertands incorporating physical activity into lifestyle. Needs Instruction    Patient understands using medications safely. Needs Instruction    Patient understands monitoring blood glucose, interpreting and using results Needs Instruction    Patient understands prevention, detection, and treatment of acute complications. Needs Instruction    Patient understands prevention, detection, and treatment of chronic complications. Needs Instruction    Patient understands how to develop strategies to address psychosocial issues. Needs Instruction    Patient understands how to develop strategies to promote health/change behavior. Needs Instruction      Complications   Last HgB A1C per patient/outside source 6.2 %   03/18/2023   How often do you check your blood sugar? 0 times/day (not testing)    Number of hyperglycemic episodes ( >200mg /dL): Never    Have you had a dilated eye exam in the past 12 months? No    Have you had a dental exam in the  past 12 months? Yes    Are you checking your feet? No      Activity / Exercise   Activity / Exercise Type ADL's      Patient Education   Previous Diabetes Education No    Disease Pathophysiology Factors that contribute to the development of diabetes;Explored patient's options for treatment of their diabetes;Definition of diabetes, type 1 and 2, and the diagnosis of diabetes    Healthy Eating Plate Method;Role of diet in the treatment of diabetes and the  relationship between the three main macronutrients and blood glucose level;Carbohydrate counting    Being Active Role of exercise on diabetes management, blood pressure control and cardiac health.;Helped patient identify appropriate exercises in relation to his/her diabetes, diabetes complications and other health issue.    Medications Reviewed patients medication for diabetes, action, purpose, timing of dose and side effects.    Monitoring Taught/evaluated SMBG meter.;Identified appropriate SMBG and/or A1C goals.    Chronic complications Relationship between chronic complications and blood glucose control;Lipid levels, blood glucose control and heart disease    Diabetes Stress and Support Identified and addressed patients feelings and concerns about diabetes;Worked with patient to identify barriers to care and solutions    Lifestyle and Health Coping Lifestyle issues that need to be addressed for better diabetes care      Individualized Goals (developed by patient)   Nutrition Follow meal plan discussed;General guidelines for healthy choices and portions discussed    Physical Activity Exercise 1-2 times per week    Medications Not Applicable    Monitoring  Test my blood glucose as discussed;Send in my blood glucose log as discussed    Problem Solving Eating Pattern    Reducing Risk examine blood glucose patterns      Post-Education Assessment   Patient understands the diabetes disease and treatment process. Comprehends key points    Patient understands incorporating nutritional management into lifestyle. Comprehends key points    Patient undertands incorporating physical activity into lifestyle. Comprehends key points    Patient understands using medications safely. N/A    Patient understands monitoring blood glucose, interpreting and using results Needs Review   SMBG taught during visit   Patient understands prevention, detection, and treatment of acute complications. N/A    Patient  understands prevention, detection, and treatment of chronic complications. Comprehends key points    Patient understands how to develop strategies to address psychosocial issues. Comprehends key points    Patient understands how to develop strategies to promote health/change behavior. Comprehends key points      Outcomes   Expected Outcomes Demonstrated interest in learning. Expect positive outcomes    Future DMSE 2 months    Program Status Not Completed             Individualized Plan for Diabetes Self-Management Training:   Learning Objective:  Patient will have a greater understanding of diabetes self-management. Patient education plan is to attend individual and/or group sessions per assessed needs and concerns.   Plan:   Patient Instructions  Check your blood sugar each morning before eating or drinking (fasting). Look for numbers between 70-100 mg/dL  Your goal Z6X is at or below 6.0%  Work towards eating three meals a day, about 5-6 hours apart!  Begin to recognize carbohydrates, proteins, and non-starchy vegetables in your food choices!  Begin to build your meals using the proportions of the Balanced Plate. First, select your carb choice(s) for the meal. Make this 25% of your meal. Next, select your  source of protein to pair with your carb choice(s). Make this another 25% of your meal. Finally, complete your meal with a variety of non-starchy vegetables. Make this the remaining 50% of your meal.  Try having fat free greek yogurt ([employer] Light [employer] Fit, [employer] Triple Zero) or Facilities manager ([employer], [place] provisions) as a high protein breakfast option.  Begin to visit your work gym on Monday and Thursday after work in the evening and exercise for 30 minutes.  Pack a gym bag on Sunday and Wednesday evenings before bed to prepare.  Expected Outcomes:  Demonstrated interest in learning. Expect positive outcomes  Education material provided: My Plate  If problems or questions,  patient to contact team via:  Phone and Email  Future DSME appointment: 2 months

## 2023-05-05 NOTE — Patient Instructions (Addendum)
 Check your blood sugar each morning before eating or drinking (fasting). Look for numbers between 70-100 mg/dL  Your goal J1B is at or below 6.0%  Work towards eating three meals a day, about 5-6 hours apart!  Begin to recognize carbohydrates, proteins, and non-starchy vegetables in your food choices!  Begin to build your meals using the proportions of the Balanced Plate. First, select your carb choice(s) for the meal. Make this 25% of your meal. Next, select your source of protein to pair with your carb choice(s). Make this another 25% of your meal. Finally, complete your meal with a variety of non-starchy vegetables. Make this the remaining 50% of your meal.  Try having fat free greek yogurt ([employer] Light [employer] Fit, [employer] Triple Zero) or Facilities manager ([employer], [place] provisions) as a high protein breakfast option.  Begin to visit your work gym on Monday and Thursday after work in the evening and exercise for 30 minutes.  Pack a gym bag on Sunday and Wednesday evenings before bed to prepare.

## 2023-06-23 ENCOUNTER — Encounter: Payer: Self-pay | Admitting: Dietician

## 2023-06-23 ENCOUNTER — Encounter: Attending: Family Medicine | Admitting: Dietician

## 2023-06-23 DIAGNOSIS — E119 Type 2 diabetes mellitus without complications: Secondary | ICD-10-CM | POA: Diagnosis present

## 2023-06-23 NOTE — Progress Notes (Signed)
 Diabetes Self-Management Education  Visit Type: Follow-up  Appt. Start Time: 1055 Appt. End Time: 1130  06/23/2023  [name], identified by name and date of birth, is a 53 y.o. male with a diagnosis of Diabetes:  .   ASSESSMENT Pt reports only checking FBG a couple times since previous visit, no particular reason, just doesn't remember when they get up, FBG values ~120 mg/dL. Pt reports lowering bread consumption, switching to healthier salad dressings, having greek yogurt w/ granola for breakfast. Pt reports they have not started going to the gym, has been walking in their neighborhood almost daily, walking at work, and doing more yard work.   Diabetes Self-Management Education - 06/23/23 1244       Visit Information   Visit Type Follow-up      Initial Visit   Are you taking your medications as prescribed? Not on Medications      Pre-Education Assessment   Patient understands the diabetes disease and treatment process. Needs Review    Patient understands incorporating nutritional management into lifestyle. Needs Review    Patient undertands incorporating physical activity into lifestyle. Needs Review    Patient understands using medications safely. N/A (comment)    Patient understands monitoring blood glucose, interpreting and using results Needs Review    Patient understands prevention, detection, and treatment of acute complications. N/A (comment)    Patient understands prevention, detection, and treatment of chronic complications. Needs Review    Patient understands how to develop strategies to address psychosocial issues. Needs Review    Patient understands how to develop strategies to promote health/change behavior. Needs Review      Complications   How often do you check your blood sugar? 0 times/day (not testing)   Rarely checking   Fasting Blood glucose range (mg/dL) 86-578      Dietary Intake   Breakfast None    Lunch Chicken broccoli casserole, small portion  of lamb chop, 2 wings, small portion of mashed    Dinner Half a Ribeye, asparagus, Chicken broccoli casserole, [place] Ultra    Beverage(s) [employer], Water      Activity / Exercise   Activity / Exercise Type ADL's;Light (walking / raking leaves)    How many days per week do you exercise? 20    How many minutes per day do you exercise? 6    Total minutes per week of exercise 120      Patient Self-Evaluation of Goals - Patient rates self as meeting previously set goals (% of time)   Nutrition 50 - 75 % (half of the time)    Physical Activity 50 - 75 % (half of the time)    Medications Not Applicable    Monitoring < 25% (hardly ever/never)    Problem Solving and behavior change strategies  25 - 50% (sometimes)    Reducing Risk (treating acute and chronic complications) 25 - 50% (sometimes)    Health Coping 25 - 50% (sometimes)      Post-Education Assessment   Patient understands the diabetes disease and treatment process. Comprehends key points    Patient understands incorporating nutritional management into lifestyle. Comprehends key points    Patient undertands incorporating physical activity into lifestyle. Comprehends key points    Patient understands using medications safely. N/A    Patient understands monitoring blood glucose, interpreting and using results Needs Review    Patient understands prevention, detection, and treatment of acute complications. N/A    Patient understands prevention, detection, and treatment of  chronic complications. Comprehends key points    Patient understands how to develop strategies to address psychosocial issues. Comprehends key points    Patient understands how to develop strategies to promote health/change behavior. Comprehends key points      Outcomes   Expected Outcomes Demonstrated interest in learning but significant barriers to change    Future DMSE 2 months    Program Status Not Completed      Subsequent Visit   Since your last visit have  you continued or begun to take your medications as prescribed? Not on Medications    Since your last visit have you had your blood pressure checked? No    Since your last visit have you experienced any weight changes? No change    Since your last visit, are you checking your blood glucose at least once a day? No             Individualized Plan for Diabetes Self-Management Training:   Learning Objective:  Patient will have a greater understanding of diabetes self-management. Patient education plan is to attend individual and/or group sessions per assessed needs and concerns.   Plan:   Patient Instructions  Make your last meal or snack no later than 8:00 pm. Choose lean proteins and minimize fried foods for your last meal of the day.  Begin leaving your glucometer on your bathroom counter in plain sight to remind you to check your fasting blood sugar each morning. Continue to look for your glucose to trend down towards 100 mg/dL or below.  Keep up the great work being active. Continue your daily walks around your neighborhood!  Pack your gym bag when you get home and put it in your car on the passenger's seat!  When having a snack, begin to portion out your carbs to equal about 15g as a serving.   Expected Outcomes:  Demonstrated interest in learning but significant barriers to change  Education material provided: Snack sheet  If problems or questions, patient to contact team via:  Phone and Email  Future DSME appointment: 2 months    Snack Options

## 2023-06-23 NOTE — Patient Instructions (Addendum)
 Make your last meal or snack no later than 8:00 pm. Choose lean proteins and minimize fried foods for your last meal of the day.  Begin leaving your glucometer on your bathroom counter in plain sight to remind you to check your fasting blood sugar each morning. Continue to look for your glucose to trend down towards 100 mg/dL or below.  Keep up the great work being active. Continue your daily walks around your neighborhood!  Pack your gym bag when you get home and put it in your car on the passenger's seat!  When having a snack, begin to portion out your carbs to equal about 15g as a serving.

## 2023-08-25 ENCOUNTER — Encounter: Payer: Self-pay | Admitting: Dietician

## 2023-08-25 ENCOUNTER — Encounter: Attending: Family Medicine | Admitting: Dietician

## 2023-08-25 VITALS — Ht 70.0 in | Wt 244.4 lb

## 2023-08-25 DIAGNOSIS — E119 Type 2 diabetes mellitus without complications: Secondary | ICD-10-CM | POA: Diagnosis present

## 2023-08-25 NOTE — Progress Notes (Signed)
 Diabetes Self-Management Education  Visit Type: Follow-up  Appt. Start Time: 1515 Appt. End Time: 1600  08/25/2023  [name], identified by name and date of birth, is a 53 y.o. male with a diagnosis of Diabetes:  .   ASSESSMENT  Height 5' 10 (1.778 m), weight 244 lb 6.4 oz (110.9 kg). Body mass index is 35.07 kg/m.  Pt reports insurance still will not cover [employer], got another 30 day sample last month and is taking it every other day. Pt reports noticing decreased appetite since taking it. Pt reports checking FBG a couple times per week, numbers have been staying closer to 100-110 mg/dL, down from ~879 mg/dL Pt reports continuing to work on making better food choices, trying to lower consumption of fried foods, eat more salads, increasing fruit and vegetable intake. Pt states they have still troubles eating later in the evening, has found themselves eating greek yogurt and granola for a late night snack now. Pt reports walking more in their neighborhood, has a neighbor they're walking with that helps with accountability, also tried walking treadmill at work but did not enjoy doing it alone.    Diabetes Self-Management Education - 08/25/23 1648       Visit Information   Visit Type Follow-up      Pre-Education Assessment   Patient understands the diabetes disease and treatment process. Comprehends key points    Patient understands incorporating nutritional management into lifestyle. Comprehends key points    Patient undertands incorporating physical activity into lifestyle. Comprehends key points    Patient understands using medications safely. N/A (comment)    Patient understands monitoring blood glucose, interpreting and using results Comprehends key points    Patient understands prevention, detection, and treatment of acute complications. N/A (comment)    Patient understands prevention, detection, and treatment of chronic complications. Compreheands key points     Patient understands how to develop strategies to address psychosocial issues. Comprehends key points    Patient understands how to develop strategies to promote health/change behavior. Comprehends key points      Complications   How often do you check your blood sugar? 3-4 times / week    Fasting Blood glucose range (mg/dL) 29-870      Dietary Intake   Breakfast 2 boiled eggs, beef sausage    Lunch Grilled chicken salad from [employer] [place] wings, small portions of rice/macaroni/potato salad, green beans    Snack (evening) Grapefruit    Beverage(s) Water      Activity / Exercise   Activity / Exercise Type ADL's;Moderate (swimming / aerobic walking)    How many days per week do you exercise? 2    How many minutes per day do you exercise? 60    Total minutes per week of exercise 120      Individualized Goals (developed by patient)   Nutrition Follow meal plan discussed    Physical Activity Exercise 3-5 times per week    Medications Not Applicable    Monitoring  Test my blood glucose as discussed    Problem Solving Eating Pattern   Late night snacking     Patient Self-Evaluation of Goals - Patient rates self as meeting previously set goals (% of time)   Nutrition 50 - 75 % (half of the time)    Physical Activity 50 - 75 % (half of the time)    Medications Not Applicable    Monitoring 25 - 50% (sometimes)    Problem Solving  and behavior change strategies  50 - 75 % (half of the time)    Reducing Risk (treating acute and chronic complications) 50 - 75 % (half of the time)    Health Coping 50 - 75 % (half of the time)      Post-Education Assessment   Patient understands the diabetes disease and treatment process. Demonstrates understanding / competency    Patient understands incorporating nutritional management into lifestyle. Comprehends key points    Patient undertands incorporating physical activity into lifestyle. Demonstrates understanding / competency    Patient  understands using medications safely. N/A    Patient understands monitoring blood glucose, interpreting and using results Comprehends key points    Patient understands prevention, detection, and treatment of acute complications. N/A    Patient understands prevention, detection, and treatment of chronic complications. Comprehends key points    Patient understands how to develop strategies to address psychosocial issues. Demonstrates understanding / competency    Patient understands how to develop strategies to promote health/change behavior. Comprehends key points      Outcomes   Expected Outcomes Demonstrated interest in learning. Expect positive outcomes    Future DMSE 3-4 months    Program Status Not Completed      Subsequent Visit   Since your last visit have you continued or begun to take your medications as prescribed? Not on Medications    Since your last visit have you had your blood pressure checked? Yes    Is your most recent blood pressure lower, unchanged, or higher since your last visit? Lower    Since your last visit have you experienced any weight changes? Loss    Weight Loss (lbs) 6    Since your last visit, are you checking your blood glucose at least once a day? No   weekly         Individualized Plan for Diabetes Self-Management Training:   Learning Objective:  Patient will have a greater understanding of diabetes self-management. Patient education plan is to attend individual and/or group sessions per assessed needs and concerns.   Plan:   Patient Instructions  Try [employer] crackers to have with your hummus!  Continue to monitor your fasting glucose a couple times per weeks, and look for your numbers to trend under 100 mg/dL!  Continue to walk with your neighbor in the mornings as often as you can.    Expected Outcomes:  Demonstrated interest in learning. Expect positive outcomes  If problems or questions, patient to contact team via:  Phone and  Email  Future DSME appointment: 3-4 months

## 2023-08-25 NOTE — Patient Instructions (Addendum)
 Try [employer] brand crackers to have with your hummus!  Continue to monitor your fasting glucose a couple times per weeks, and look for your numbers to trend under 100 mg/dL!  Continue to walk with your neighbor in the mornings as often as you can.

## 2023-11-13 ENCOUNTER — Other Ambulatory Visit: Payer: Self-pay | Admitting: Medical Genetics

## 2023-12-01 ENCOUNTER — Ambulatory Visit: Admitting: Dietician

## 2023-12-03 ENCOUNTER — Other Ambulatory Visit (HOSPITAL_COMMUNITY)
Admission: RE | Admit: 2023-12-03 | Discharge: 2023-12-03 | Disposition: A | Discharge status: Home / Self Care | Payer: Self-pay | Source: Ambulatory Visit | Attending: Medical Genetics | Admitting: Medical Genetics

## 2023-12-17 LAB — GENECONNECT MOLECULAR SCREEN: Genetic Analysis Overall Interpretation: NEGATIVE
